# Patient Record
Sex: Female | Born: 1995 | Race: Black or African American | Hispanic: No | Marital: Single | Smoking: Former smoker
Health system: Southern US, Community
[De-identification: ages and names within clinical notes are randomized; demographics above are authoritative.]

## PROBLEM LIST (undated history)

## (undated) DIAGNOSIS — L309 Dermatitis, unspecified: Secondary | ICD-10-CM

## (undated) DIAGNOSIS — J45909 Unspecified asthma, uncomplicated: Secondary | ICD-10-CM

## (undated) DIAGNOSIS — B009 Herpesviral infection, unspecified: Secondary | ICD-10-CM

## (undated) DIAGNOSIS — D649 Anemia, unspecified: Secondary | ICD-10-CM

## (undated) HISTORY — PX: NO PAST SURGERIES: SHX2092

---
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
## 2017-02-03 DIAGNOSIS — R112 Nausea with vomiting, unspecified: Secondary | ICD-10-CM | POA: Insufficient documentation

## 2017-02-03 DIAGNOSIS — R197 Diarrhea, unspecified: Secondary | ICD-10-CM | POA: Diagnosis not present

## 2017-02-03 NOTE — ED Notes (Signed)
Pt c/o weakness, n/v x 4 and diarrhea x 8 starting 3 days ago.

## 2017-02-04 ENCOUNTER — Encounter (HOSPITAL_COMMUNITY): Payer: Self-pay

## 2017-02-04 ENCOUNTER — Emergency Department (HOSPITAL_COMMUNITY)
Admission: EM | Admit: 2017-02-04 | Discharge: 2017-02-04 | Discharge status: Against Medical Advice | Payer: BLUE CROSS/BLUE SHIELD | Attending: Emergency Medicine | Admitting: Emergency Medicine

## 2017-02-04 DIAGNOSIS — R112 Nausea with vomiting, unspecified: Secondary | ICD-10-CM

## 2017-02-04 DIAGNOSIS — R197 Diarrhea, unspecified: Secondary | ICD-10-CM

## 2017-02-04 LAB — CBC
HCT: 41.9 % (ref 36.0–46.0)
HEMOGLOBIN: 13.7 g/dL (ref 12.0–15.0)
MCH: 30.5 pg (ref 26.0–34.0)
MCHC: 32.7 g/dL (ref 30.0–36.0)
MCV: 93.3 fL (ref 78.0–100.0)
Platelets: 330 10*3/uL (ref 150–400)
RBC: 4.49 MIL/uL (ref 3.87–5.11)
RDW: 12.9 % (ref 11.5–15.5)
WBC: 6.2 10*3/uL (ref 4.0–10.5)

## 2017-02-04 LAB — COMPREHENSIVE METABOLIC PANEL
ALK PHOS: 62 U/L (ref 38–126)
ALT: 14 U/L (ref 14–54)
ANION GAP: 9 (ref 5–15)
AST: 24 U/L (ref 15–41)
Albumin: 4.3 g/dL (ref 3.5–5.0)
BILIRUBIN TOTAL: 0.5 mg/dL (ref 0.3–1.2)
BUN: 10 mg/dL (ref 6–20)
CALCIUM: 9.1 mg/dL (ref 8.9–10.3)
CO2: 22 mmol/L (ref 22–32)
Chloride: 106 mmol/L (ref 101–111)
Creatinine, Ser: 0.78 mg/dL (ref 0.44–1.00)
Glucose, Bld: 61 mg/dL — ABNORMAL LOW (ref 65–99)
Potassium: 3.3 mmol/L — ABNORMAL LOW (ref 3.5–5.1)
SODIUM: 137 mmol/L (ref 135–145)
TOTAL PROTEIN: 8.4 g/dL — AB (ref 6.5–8.1)

## 2017-02-04 LAB — URINALYSIS, ROUTINE W REFLEX MICROSCOPIC
Bilirubin Urine: NEGATIVE
Glucose, UA: NEGATIVE mg/dL
Hgb urine dipstick: NEGATIVE
Ketones, ur: NEGATIVE mg/dL
Leukocytes, UA: NEGATIVE
NITRITE: NEGATIVE
PROTEIN: NEGATIVE mg/dL
SPECIFIC GRAVITY, URINE: 1.013 (ref 1.005–1.030)
pH: 6 (ref 5.0–8.0)

## 2017-02-04 LAB — LIPASE, BLOOD: Lipase: 31 U/L (ref 11–51)

## 2017-02-04 LAB — POC URINE PREG, ED: PREG TEST UR: NEGATIVE

## 2017-02-04 MED ORDER — ONDANSETRON 8 MG PO TBDP
8.0000 mg | ORAL_TABLET | Freq: Once | ORAL | Status: AC
  Administered 2017-02-04: 8 mg via ORAL
  Filled 2017-02-04: qty 1

## 2017-02-04 MED ORDER — POTASSIUM CHLORIDE CRYS ER 20 MEQ PO TBCR
40.0000 meq | EXTENDED_RELEASE_TABLET | Freq: Once | ORAL | Status: AC
  Administered 2017-02-04: 40 meq via ORAL
  Filled 2017-02-04: qty 2

## 2017-02-04 MED ORDER — LOPERAMIDE HCL 2 MG PO CAPS
4.0000 mg | ORAL_CAPSULE | Freq: Once | ORAL | Status: AC
  Administered 2017-02-04: 4 mg via ORAL
  Filled 2017-02-04: qty 2

## 2017-02-04 NOTE — ED Notes (Signed)
Pt is alert and oriented x 4 and verbally responsive. Pt is c/o N/V/D. Pt denies abd pain at this time. Pt was given juice and soda to correct glucose of 60, and peanut butter and cracker.

## 2017-02-04 NOTE — ED Notes (Signed)
Pt stated that she had to leave " my nephew is not breathing and I have to go wait for the ambulance, pt stated that she would come back. Made Dr. Lynelle DoctorKnapp aware.

## 2017-02-04 NOTE — ED Triage Notes (Signed)
Pt reports 3-4 days of n/v, diarrhea, and 6/10 abd pain. Pt A+OX4, speaking in complete sentences, ambulatory to triage.

## 2017-02-04 NOTE — ED Notes (Signed)
Unable to obtain discharge  V/S or signature for AMA as pt left in a rush and without furthur evaluation.
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
## 2017-02-04 NOTE — ED Notes (Signed)
Pt stated no nausea.

## 2017-04-15 ENCOUNTER — Emergency Department (HOSPITAL_COMMUNITY)
Admission: EM | Admit: 2017-04-15 | Discharge: 2017-04-15 | Discharge status: Against Medical Advice | Payer: BLUE CROSS/BLUE SHIELD | Attending: Emergency Medicine | Admitting: Emergency Medicine

## 2017-04-15 DIAGNOSIS — Z5321 Procedure and treatment not carried out due to patient leaving prior to being seen by health care provider: Secondary | ICD-10-CM | POA: Insufficient documentation

## 2017-04-15 NOTE — ED Notes (Signed)
Called pt 3x and no response

## 2017-04-17 ENCOUNTER — Emergency Department (HOSPITAL_COMMUNITY)
Admission: EM | Admit: 2017-04-17 | Discharge: 2017-04-17 | Disposition: A | Discharge status: Home / Self Care | Payer: BLUE CROSS/BLUE SHIELD

## 2017-04-17 NOTE — ED Notes (Addendum)
Writer called for room assignment in triage, no response. 

## 2017-05-17 ENCOUNTER — Emergency Department (HOSPITAL_COMMUNITY)
Admission: EM | Admit: 2017-05-17 | Discharge: 2017-05-17 | Disposition: A | Discharge status: Home / Self Care | Payer: BLUE CROSS/BLUE SHIELD | Attending: Emergency Medicine | Admitting: Emergency Medicine

## 2017-05-17 ENCOUNTER — Encounter (HOSPITAL_COMMUNITY): Payer: Self-pay

## 2017-05-17 DIAGNOSIS — S0181XA Laceration without foreign body of other part of head, initial encounter: Secondary | ICD-10-CM

## 2017-05-17 DIAGNOSIS — W208XXA Other cause of strike by thrown, projected or falling object, initial encounter: Secondary | ICD-10-CM | POA: Insufficient documentation

## 2017-05-17 DIAGNOSIS — Y939 Activity, unspecified: Secondary | ICD-10-CM | POA: Diagnosis not present

## 2017-05-17 DIAGNOSIS — Y92009 Unspecified place in unspecified non-institutional (private) residence as the place of occurrence of the external cause: Secondary | ICD-10-CM | POA: Insufficient documentation

## 2017-05-17 DIAGNOSIS — Y998 Other external cause status: Secondary | ICD-10-CM | POA: Diagnosis not present

## 2017-05-17 MED ORDER — LIDOCAINE HCL (PF) 1 % IJ SOLN
5.0000 mL | Freq: Once | INTRAMUSCULAR | Status: AC
  Administered 2017-05-17: 5 mL via INTRADERMAL

## 2017-05-17 MED ORDER — LIDOCAINE HCL 2 % IJ SOLN: 10.0000 mL | Freq: Once | INTRAMUSCULAR | Status: DC

## 2017-05-17 MED ORDER — TETANUS-DIPHTH-ACELL PERTUSSIS 5-2.5-18.5 LF-MCG/0.5 IM SUSP
0.5000 mL | Freq: Once | INTRAMUSCULAR | Status: AC
  Administered 2017-05-17: 0.5 mL via INTRAMUSCULAR
  Filled 2017-05-17: qty 0.5

## 2017-05-17 NOTE — Discharge Instructions (Signed)
Please read attached information regarding wound care. Return in 3-5 days for suture removal, which can be done at ED, urgent care or PCP office. Return to ED sooner for drainage from site, swelling, fevers, signs of infection.
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
## 2017-07-31 ENCOUNTER — Emergency Department (HOSPITAL_COMMUNITY)
Admission: EM | Admit: 2017-07-31 | Discharge: 2017-07-31 | Disposition: A | Discharge status: Home / Self Care | Payer: BLUE CROSS/BLUE SHIELD | Attending: Emergency Medicine | Admitting: Emergency Medicine

## 2017-07-31 ENCOUNTER — Encounter (HOSPITAL_COMMUNITY): Payer: Self-pay | Admitting: Emergency Medicine

## 2017-07-31 DIAGNOSIS — Z79891 Long term (current) use of opiate analgesic: Secondary | ICD-10-CM | POA: Diagnosis not present

## 2017-07-31 DIAGNOSIS — J029 Acute pharyngitis, unspecified: Secondary | ICD-10-CM | POA: Diagnosis present

## 2017-07-31 LAB — RAPID STREP SCREEN (MED CTR MEBANE ONLY): STREPTOCOCCUS, GROUP A SCREEN (DIRECT): NEGATIVE

## 2017-07-31 MED ORDER — LIDOCAINE VISCOUS 2 % MT SOLN: 15.0000 mL | OROMUCOSAL | 0 refills | Status: DC | PRN

## 2017-07-31 MED ORDER — DEXAMETHASONE SODIUM PHOSPHATE 10 MG/ML IJ SOLN
10.0000 mg | Freq: Once | INTRAMUSCULAR | Status: AC
  Administered 2017-07-31: 10 mg via INTRAMUSCULAR
  Filled 2017-07-31: qty 1

## 2017-07-31 NOTE — ED Triage Notes (Signed)
Pt comes in with complaints of a sore throat that she is unsure when started but stated the unbearable pain began today.  Reports spots on the back of her throat. No spots noted during assessment by this RN.  Pt speaking in full sentences in no acute distress. Afebrile.

## 2017-07-31 NOTE — Discharge Instructions (Signed)
Take the prescribed medication as directed. Your strep culture should come back in a day or two.  We will call you if abnormal. Follow-up with your primary care doctor if any ongoing issues. Return to the ED for new or worsening symptoms.

## 2017-07-31 NOTE — ED Provider Notes (Signed)
[facility]-EMERGENCY DEPT Provider Note   CSN: [id number] Arrival date & time: 07/31/17  0000     History   Chief Complaint Chief Complaint  Patient presents with  . Sore Throat    HPI Sharon Morris is a 21 y.o. female.  The history is provided by the patient and medical records.  Sore Throat     21 y.o. F with hx of eczema and herpes, presenting to the ED for sore throat.  States it began yesterday evening.  No associated symptoms.  No trouble swallowing, just painful when doing so.  No fever, chills, sweats.  No sick contacts.  No medications tried PTA.     Past Medical History:  Diagnosis Date  . Eczema   . Herpes    last outbreak in August    There are no active problems to display for this patient.   Past Surgical History:  Procedure Laterality Date  . NO PAST SURGERIES      OB History    No data available       Home Medications    Prior to Admission medications   Medication Sig Start Date End Date Taking? Authorizing Provider  cetirizine (ZYRTEC) 10 MG tablet Take 10 mg by mouth daily as needed for allergies.     [provider]  triamcinolone (NASACORT) 55 MCG/ACT AERO nasal inhaler Place 2 sprays into the nose daily as needed (for rhinitis).     [provider]  triamcinolone ointment (KENALOG) 0.1 % Apply 1 application topically daily. 01/10/17   [provider]  valACYclovir (VALTREX) 500 MG tablet Take 500 mg by mouth 2 (two) times daily.    [provider]    Family History Family History  Problem Relation Age of Onset  . Hypertension Other   . Diabetes Other   . Cancer Other   . Stroke Other     Social History Social History   Tobacco Use  . Smoking status: Never Smoker  . Smokeless tobacco: Never Used  Substance Use Topics  . Alcohol use: No  . Drug use: Yes    Types: Marijuana     Allergies   Patient has no known allergies.   Review of Systems Review of Systems    HENT: Positive for sore throat.   All other systems reviewed and are negative.    Physical Exam Updated Vital Signs BP 116/70 (BP Location: Left Arm)   Pulse 68   Temp 97.6 F (36.4 C) (Oral)   Resp 18   LMP 07/23/2017 (Approximate)   SpO2 98%   Physical Exam  Constitutional: She is oriented to person, place, and time. She appears well-developed and well-nourished.  HENT:  Head: Normocephalic and atraumatic.  Mouth/Throat: Uvula is midline, oropharynx is clear and moist and mucous membranes are normal.  Tonsils 1+ bilaterally without exudates; uvula midline without evidence of peritonsillar abscess; handling secretions appropriately; no difficulty swallowing or speaking; normal phonation without stridor  Eyes: Conjunctivae and EOM are normal. Pupils are equal, round, and reactive to light.  Neck: Normal range of motion.  Cardiovascular: Normal rate, regular rhythm and normal heart sounds.  Pulmonary/Chest: Effort normal and breath sounds normal.  Abdominal: Soft. Bowel sounds are normal.  Musculoskeletal: Normal range of motion.  Neurological: She is alert and oriented to person, place, and time.  Skin: Skin is warm and dry.  Psychiatric: She has a normal mood and affect.  Nursing note and vitals reviewed.    ED Treatments /  Results  Labs (all labs ordered are listed, but only abnormal results are displayed) Labs Reviewed  RAPID STREP SCREEN (NOT AT [facility])  CULTURE, GROUP A STREP [facility])    EKG  EKG Interpretation None       Radiology No results found.  Procedures Procedures (including critical care time)  Medications Ordered in ED Medications  dexamethasone (DECADRON) injection 10 mg (10 mg Intramuscular Given 07/31/17 0201)     Initial Impression / Assessment and Plan / ED Course  I have reviewed the triage vital signs and the nursing notes.  Pertinent labs & imaging results that were available during my care of the patient were reviewed by me and  considered in my medical decision making (see chart for details).  21 year old female here with sore throat.  Began yesterday.  No associated symptoms.  She is afebrile and nontoxic.  Does have mild tonsillar edema bilaterally without exudate.  Handling secretions well.  Normal phonation without stridor.  No noted lymphadenopathy.  No findings of peritonsillar abscess.  Rapid strep was sent and is negative, culture pending.  May represent viral pharyngitis.  She was given dose of IM Decadron here to help with tonsillar edema and discomfort.  Will discharge home with viscous lidocaine.  Can follow-up with PCP for any ongoing issues.  Discussed plan with patient, she acknowledged understanding and agreed with plan of care.  Return precautions given for new or worsening symptoms.  Final Clinical Impressions(s) / ED Diagnoses   Final diagnoses:  Sore throat    ED Discharge Orders        Ordered    lidocaine (XYLOCAINE) 2 % solution  As needed     07/31/17 0214       Garlon HatchetSanders, Geneva Barrero M, PA-C 07/31/17 [phone]    Dione BoozeGlick, David, MD 07/31/17 [phone]

## 2017-08-02 LAB — CULTURE, GROUP A STREP (THRC)

## 2017-09-05 ENCOUNTER — Encounter (HOSPITAL_COMMUNITY): Payer: Self-pay

## 2017-09-05 ENCOUNTER — Emergency Department (HOSPITAL_COMMUNITY)
Admission: EM | Admit: 2017-09-05 | Discharge: 2017-09-05 | Disposition: A | Discharge status: Home / Self Care | Payer: BLUE CROSS/BLUE SHIELD | Attending: Emergency Medicine | Admitting: Emergency Medicine

## 2017-09-05 DIAGNOSIS — R309 Painful micturition, unspecified: Secondary | ICD-10-CM | POA: Insufficient documentation

## 2017-09-05 DIAGNOSIS — Z5321 Procedure and treatment not carried out due to patient leaving prior to being seen by health care provider: Secondary | ICD-10-CM | POA: Insufficient documentation

## 2017-09-05 LAB — URINALYSIS, ROUTINE W REFLEX MICROSCOPIC
BILIRUBIN URINE: NEGATIVE
Bacteria, UA: NONE SEEN
GLUCOSE, UA: NEGATIVE mg/dL
Ketones, ur: NEGATIVE mg/dL
LEUKOCYTES UA: NEGATIVE
Nitrite: NEGATIVE
PH: 7 (ref 5.0–8.0)
PROTEIN: NEGATIVE mg/dL
Specific Gravity, Urine: 1.021 (ref 1.005–1.030)

## 2017-09-05 LAB — PREGNANCY, URINE: Preg Test, Ur: NEGATIVE

## 2017-09-05 NOTE — ED Notes (Signed)
Bed: [facility] Expected date:  Expected time:  Means of arrival:  Comments: 

## 2017-09-05 NOTE — ED Triage Notes (Signed)
Pt complains of burning when urinating

## 2017-09-05 NOTE — ED Notes (Signed)
Pt no longer in the lobby 

## 2017-09-05 NOTE — ED Triage Notes (Signed)
Pt called from triage no answer 

## 2017-10-22 ENCOUNTER — Emergency Department (HOSPITAL_COMMUNITY): Payer: BLUE CROSS/BLUE SHIELD

## 2017-10-22 ENCOUNTER — Encounter (HOSPITAL_COMMUNITY): Payer: Self-pay | Admitting: Emergency Medicine

## 2017-10-22 DIAGNOSIS — Z5321 Procedure and treatment not carried out due to patient leaving prior to being seen by health care provider: Secondary | ICD-10-CM | POA: Insufficient documentation

## 2017-10-22 DIAGNOSIS — R531 Weakness: Secondary | ICD-10-CM | POA: Diagnosis not present

## 2017-10-22 LAB — INFLUENZA PANEL BY PCR (TYPE A & B)
Influenza A By PCR: POSITIVE — AB
Influenza B By PCR: NEGATIVE

## 2017-10-22 MED ORDER — ACETAMINOPHEN 325 MG PO TABS
650.0000 mg | ORAL_TABLET | Freq: Once | ORAL | Status: AC | PRN
  Administered 2017-10-22: 650 mg via ORAL
  Filled 2017-10-22: qty 2

## 2017-10-22 NOTE — ED Triage Notes (Signed)
Pt comes in with complaints of flu like symptoms that started yesterday. Pt endorses 2 episodes of emesis. States she feels very weak. Febrile on assessment.

## 2017-10-23 ENCOUNTER — Emergency Department (HOSPITAL_COMMUNITY)
Admission: EM | Admit: 2017-10-23 | Discharge: 2017-10-23 | Disposition: A | Discharge status: Home / Self Care | Payer: BLUE CROSS/BLUE SHIELD | Attending: Emergency Medicine | Admitting: Emergency Medicine

## 2017-10-23 NOTE — ED Notes (Signed)
Pt seen leaving the lobby with her family

## 2017-12-02 ENCOUNTER — Emergency Department (HOSPITAL_COMMUNITY): Payer: BLUE CROSS/BLUE SHIELD

## 2017-12-02 ENCOUNTER — Other Ambulatory Visit: Payer: Self-pay

## 2017-12-02 ENCOUNTER — Emergency Department (HOSPITAL_COMMUNITY)
Admission: EM | Admit: 2017-12-02 | Discharge: 2017-12-02 | Disposition: A | Discharge status: Home / Self Care | Payer: BLUE CROSS/BLUE SHIELD | Attending: Emergency Medicine | Admitting: Emergency Medicine

## 2017-12-02 ENCOUNTER — Encounter (HOSPITAL_COMMUNITY): Payer: Self-pay | Admitting: *Deleted

## 2017-12-02 DIAGNOSIS — F17290 Nicotine dependence, other tobacco product, uncomplicated: Secondary | ICD-10-CM | POA: Diagnosis not present

## 2017-12-02 DIAGNOSIS — Z79899 Other long term (current) drug therapy: Secondary | ICD-10-CM | POA: Diagnosis not present

## 2017-12-02 DIAGNOSIS — J45909 Unspecified asthma, uncomplicated: Secondary | ICD-10-CM | POA: Diagnosis present

## 2017-12-02 DIAGNOSIS — J209 Acute bronchitis, unspecified: Secondary | ICD-10-CM | POA: Diagnosis not present

## 2017-12-02 HISTORY — DX: Unspecified asthma, uncomplicated: J45.909

## 2017-12-02 MED ORDER — ALBUTEROL SULFATE (2.5 MG/3ML) 0.083% IN NEBU
INHALATION_SOLUTION | RESPIRATORY_TRACT | Status: AC
  Filled 2017-12-02: qty 6

## 2017-12-02 MED ORDER — ALBUTEROL SULFATE (2.5 MG/3ML) 0.083% IN NEBU
5.0000 mg | INHALATION_SOLUTION | Freq: Once | RESPIRATORY_TRACT | Status: AC
Start: 2017-12-02 — End: 2017-12-02
  Administered 2017-12-02: 5 mg via RESPIRATORY_TRACT

## 2017-12-02 MED ORDER — PREDNISONE 20 MG PO TABS: 40.0000 mg | ORAL_TABLET | Freq: Every day | ORAL | 0 refills | Status: DC

## 2017-12-02 MED ORDER — ALBUTEROL SULFATE 108 (90 BASE) MCG/ACT IN AEPB: 1.0000 | INHALATION_SPRAY | Freq: Four times a day (QID) | RESPIRATORY_TRACT | 0 refills | Status: DC | PRN

## 2017-12-02 NOTE — ED Notes (Signed)
Improved sob and wheezing after breathing tx.

## 2017-12-02 NOTE — ED Triage Notes (Signed)
Pt states cough for several days and increased sob, wheezing and chest tightness today.  Wheezing noted.

## 2017-12-02 NOTE — Discharge Instructions (Signed)
Continue antibiotic prescribed to you Use inhaler every 4-6 hours as needed for shortness of breath/wheezing Take prednisone once a day for 5 days Follow up with your doctor

## 2017-12-02 NOTE — ED Provider Notes (Signed)
[facility] EMERGENCY DEPARTMENT Provider Note   CSN: [id number] Arrival date & time: 12/02/17  1545     History   Chief Complaint Chief Complaint  Patient presents with  . Asthma    HPI Sharon Morris is a 22 y.o. female who presents with a cough, SOB, wheezing. PMH significant for hx of smoking. She had the flu in last Jan. She developed another viral illness in February. She's had a persistent productive cough since then. She sought medical care on 3/8 and was diagnosed with bronchitis. She was given Doxycycline and cough medicine. Over the past three days she reports more shortness of breath, chest tightness, and wheezing. She is unsure if she's actually diagnosed with asthma or not. Her mother in the room states she did not have asthma as a child but her siblings did. The patient denies current fever or chills. She feels better after a breathing tx in triage.  HPI  Past Medical History:  Diagnosis Date  . Asthma   . Eczema   . Herpes    last outbreak in August    There are no active problems to display for this patient.   Past Surgical History:  Procedure Laterality Date  . NO PAST SURGERIES      OB History    No data available       Home Medications    Prior to Admission medications   Medication Sig Start Date End Date Taking? Authorizing Provider  Albuterol Sulfate (PROAIR RESPICLICK) 108 (90 Base) MCG/ACT AEPB Inhale 1-2 puffs into the lungs every 6 (six) hours as needed. 12/02/17   Bethel Born, PA-C  cetirizine (ZYRTEC) 10 MG tablet Take 10 mg by mouth daily as needed for allergies.     [provider]  lidocaine (XYLOCAINE) 2 % solution Use as directed 15 mLs as needed in the mouth or throat for mouth pain. 07/31/17   Garlon Hatchet, PA-C  predniSONE (DELTASONE) 20 MG tablet Take 2 tablets (40 mg total) by mouth daily. 12/02/17   Bethel Born, PA-C  triamcinolone (NASACORT) 55 MCG/ACT AERO nasal inhaler Place 2 sprays  into the nose daily as needed (for rhinitis).     [provider]  triamcinolone ointment (KENALOG) 0.1 % Apply 1 application topically daily. 01/10/17   [provider]  valACYclovir (VALTREX) 500 MG tablet Take 500 mg by mouth 2 (two) times daily.    [provider]    Family History Family History  Problem Relation Age of Onset  . Hypertension Other   . Diabetes Other   . Cancer Other   . Stroke Other     Social History Social History   Tobacco Use  . Smoking status: Current Every Day Smoker    Types: Cigars  . Smokeless tobacco: Never Used  Substance Use Topics  . Alcohol use: Yes  . Drug use: Yes    Types: Marijuana     Allergies   Patient has no known allergies.   Review of Systems Review of Systems  HENT: Negative for congestion and rhinorrhea.   Respiratory: Positive for cough, shortness of breath and wheezing.   Cardiovascular: Negative for chest pain.     Physical Exam Updated Vital Signs BP 118/79 (BP Location: Right Arm)   Pulse (!) 101   Temp 98.2 F (36.8 C) (Oral)   Resp (!) 26   Ht 5\' 3"  (1.6 m)   Wt 59.9 kg (132 lb)   LMP 11/19/2017  BMI 23.38 kg/m   Physical Exam  Constitutional: She is oriented to person, place, and time. She appears well-developed and well-nourished. No distress.  NAD. Occasional coarse cough  HENT:  Head: Normocephalic and atraumatic.  Eyes: Conjunctivae are normal. Pupils are equal, round, and reactive to light. Right eye exhibits no discharge. Left eye exhibits no discharge. No scleral icterus.  Neck: Normal range of motion.  Cardiovascular: Normal rate and regular rhythm.  Pulmonary/Chest: Effort normal and breath sounds normal. No respiratory distress.  Abdominal: Soft. Bowel sounds are normal. She exhibits no distension. There is no tenderness.  Neurological: She is alert and oriented to person, place, and time.  Skin: Skin is warm and dry.  Psychiatric: She has a normal mood and  affect. Her behavior is normal.  Nursing note and vitals reviewed.    ED Treatments / Results  Labs (all labs ordered are listed, but only abnormal results are displayed) Labs Reviewed - No data to display  EKG  EKG Interpretation None       Radiology Dg Chest 2 View  Result Date: 12/02/2017 CLINICAL DATA:  Difficulty breathing EXAM: CHEST - 2 VIEW COMPARISON:  10/22/2017 FINDINGS: Mild peribronchial thickening. Heart and mediastinal contours are within normal limits. No focal opacities or effusions. No acute bony abnormality. IMPRESSION: Bronchitic changes. Electronically Signed   By: Charlett NoseKevin  Dover M.D.   On: 12/02/2017 18:01    Procedures Procedures (including critical care time)  Medications Ordered in ED Medications  albuterol (PROVENTIL) (2.5 MG/3ML) 0.083% nebulizer solution 5 mg (5 mg Nebulization Given 12/02/17 1611)     Initial Impression / Assessment and Plan / ED Course  I have reviewed the triage vital signs and the nursing notes.  Pertinent labs & imaging results that were available during my care of the patient were reviewed by me and considered in my medical decision making (see chart for details).  22 year old female presents with acute bronchitis and now shortness of breath and wheezing.  She is mildly tachycardic in triage.  Otherwise vital signs are normal.  Chest x-ray is remarkable for bronchitis without evidence of pneumonia.  Lungs are clear to auscultation bilaterally after a breathing treatment.  She reports feeling better.  Will discharge with prescription for an inhaler and steroids.  She was advised to follow-up with her doctor if she is not improving.  Final Clinical Impressions(s) / ED Diagnoses   Final diagnoses:  Acute bronchitis, unspecified organism    ED Discharge Orders        Ordered    Albuterol Sulfate (PROAIR RESPICLICK) 108 (90 Base) MCG/ACT AEPB  Every 6 hours PRN     12/02/17 1912    predniSONE (DELTASONE) 20 MG tablet  Daily      12/02/17 1912       Bethel BornGekas, Henretter Piekarski Marie, PA-C 12/02/17 1931    Mancel BaleWentz, Elliott, MD 12/03/17 [phone]

## 2018-01-11 ENCOUNTER — Emergency Department (HOSPITAL_COMMUNITY): Payer: BLUE CROSS/BLUE SHIELD

## 2018-01-11 ENCOUNTER — Encounter (HOSPITAL_COMMUNITY): Payer: Self-pay | Admitting: Emergency Medicine

## 2018-01-11 ENCOUNTER — Emergency Department (HOSPITAL_COMMUNITY)
Admission: EM | Admit: 2018-01-11 | Discharge: 2018-01-11 | Disposition: A | Discharge status: Home / Self Care | Payer: BLUE CROSS/BLUE SHIELD | Attending: Emergency Medicine | Admitting: Emergency Medicine

## 2018-01-11 DIAGNOSIS — F17290 Nicotine dependence, other tobacco product, uncomplicated: Secondary | ICD-10-CM | POA: Insufficient documentation

## 2018-01-11 DIAGNOSIS — Z79899 Other long term (current) drug therapy: Secondary | ICD-10-CM | POA: Diagnosis not present

## 2018-01-11 DIAGNOSIS — J4521 Mild intermittent asthma with (acute) exacerbation: Secondary | ICD-10-CM | POA: Diagnosis not present

## 2018-01-11 DIAGNOSIS — R0602 Shortness of breath: Secondary | ICD-10-CM | POA: Diagnosis present

## 2018-01-11 MED ORDER — DEXAMETHASONE SODIUM PHOSPHATE 10 MG/ML IJ SOLN
10.0000 mg | Freq: Once | INTRAMUSCULAR | Status: AC
  Administered 2018-01-11: 10 mg via INTRAMUSCULAR
  Filled 2018-01-11: qty 1

## 2018-01-11 MED ORDER — ALBUTEROL SULFATE HFA 108 (90 BASE) MCG/ACT IN AERS
1.0000 | INHALATION_SPRAY | Freq: Once | RESPIRATORY_TRACT | Status: AC
  Administered 2018-01-11: 1 via RESPIRATORY_TRACT
  Filled 2018-01-11: qty 6.7

## 2018-01-11 MED ORDER — ALBUTEROL SULFATE (2.5 MG/3ML) 0.083% IN NEBU
5.0000 mg | INHALATION_SOLUTION | Freq: Once | RESPIRATORY_TRACT | Status: AC
  Administered 2018-01-11: 5 mg via RESPIRATORY_TRACT
  Filled 2018-01-11: qty 6

## 2018-01-11 MED ORDER — IPRATROPIUM-ALBUTEROL 0.5-2.5 (3) MG/3ML IN SOLN
3.0000 mL | Freq: Once | RESPIRATORY_TRACT | Status: AC
  Administered 2018-01-11: 3 mL via RESPIRATORY_TRACT
  Filled 2018-01-11: qty 3

## 2018-01-11 MED ORDER — ALBUTEROL SULFATE HFA 108 (90 BASE) MCG/ACT IN AERS: 1.0000 | INHALATION_SPRAY | Freq: Four times a day (QID) | RESPIRATORY_TRACT | 0 refills | Status: DC | PRN

## 2018-01-11 NOTE — ED Triage Notes (Signed)
Pt c/o shortness of breath and increased use of inhalers. Wheezing in bilateral lobes.

## 2018-01-11 NOTE — ED Notes (Signed)
Pt states that when she woke up this AM, she felt short of breath and when her sister checked her pulse ox, it was low. States that she ran out of her inhaler last night and will need a new one.

## 2018-01-11 NOTE — ED Provider Notes (Signed)
[facility] EMERGENCY DEPARTMENT Provider Note   CSN: [id number] Arrival date & time: 01/11/18  1626     History   Chief Complaint Chief Complaint  Patient presents with  . Asthma    HPI Sharon Morris is a 22 y.o. female with past medical history of asthma, who presents to ED for evaluation of shortness of breath and wheezing since this morning.  She states that she ran out of her albuterol inhaler yesterday which is the cause of her worsening symptoms.  She also reports intermittent productive cough and nasal congestion.  Denies any fevers, vomiting, sick contacts with similar symptoms, chest pain, hemoptysis.  HPI  Past Medical History:  Diagnosis Date  . Asthma   . Eczema   . Herpes    last outbreak in August    There are no active problems to display for this patient.   Past Surgical History:  Procedure Laterality Date  . NO PAST SURGERIES       OB History   None      Home Medications    Prior to Admission medications   Medication Sig Start Date End Date Taking? Authorizing Provider  albuterol (PROVENTIL HFA;VENTOLIN HFA) 108 (90 Base) MCG/ACT inhaler Inhale 1-2 puffs into the lungs every 6 (six) hours as needed for wheezing or shortness of breath. 01/11/18   Rylea Selway, PA-C  cetirizine (ZYRTEC) 10 MG tablet Take 10 mg by mouth daily as needed for allergies.     [provider]  lidocaine (XYLOCAINE) 2 % solution Use as directed 15 mLs as needed in the mouth or throat for mouth pain. 07/31/17   Garlon Hatchet, PA-C  predniSONE (DELTASONE) 20 MG tablet Take 2 tablets (40 mg total) by mouth daily. 12/02/17   Bethel Born, PA-C  triamcinolone (NASACORT) 55 MCG/ACT AERO nasal inhaler Place 2 sprays into the nose daily as needed (for rhinitis).     [provider]  triamcinolone ointment (KENALOG) 0.1 % Apply 1 application topically daily. 01/10/17   [provider]  valACYclovir (VALTREX) 500 MG tablet Take 500  mg by mouth 2 (two) times daily.    [provider]    Family History Family History  Problem Relation Age of Onset  . Hypertension Other   . Diabetes Other   . Cancer Other   . Stroke Other     Social History Social History   Tobacco Use  . Smoking status: Current Every Day Smoker    Types: Cigars  . Smokeless tobacco: Never Used  Substance Use Topics  . Alcohol use: Yes  . Drug use: Yes    Types: Marijuana     Allergies   Patient has no known allergies.   Review of Systems Review of Systems  Constitutional: Negative for chills and fever.  HENT: Positive for congestion. Negative for postnasal drip.   Respiratory: Positive for shortness of breath and wheezing. Negative for cough and choking.   Cardiovascular: Negative for chest pain.  Gastrointestinal: Negative for nausea and vomiting.     Physical Exam Updated Vital Signs BP 114/76 (BP Location: Left Arm)   Pulse 90   Temp 98.3 F (36.8 C) (Oral)   Resp 18   Ht 5\' 3"  (1.6 m)   SpO2 100%   BMI 23.38 kg/m   Physical Exam  Constitutional: She appears well-developed and well-nourished. No distress.  Nontoxic appearing and in no acute distress.  Speaking complete sentences without difficulty.  HENT:  Head: Normocephalic  and atraumatic.  Eyes: Conjunctivae and EOM are normal. No scleral icterus.  Neck: Normal range of motion.  Cardiovascular: Normal rate, regular rhythm and normal heart sounds.  Pulmonary/Chest: Effort normal. No respiratory distress. She has wheezes.  Improvement in wheezing with albuterol nebulizer solution given. Mild end expiratory wheezing heard in LLL field.  Neurological: She is alert.  Skin: No rash noted. She is not diaphoretic.  Psychiatric: She has a normal mood and affect.  Nursing note and vitals reviewed.    ED Treatments / Results  Labs (all labs ordered are listed, but only abnormal results are displayed) Labs Reviewed - No data to  display  EKG None  Radiology Dg Chest 2 View  Result Date: 01/11/2018 CLINICAL DATA:  Shortness of breath. EXAM: CHEST - 2 VIEW COMPARISON:  12/02/2017 FINDINGS: The heart size and mediastinal contours are within normal limits. There is no evidence of pulmonary edema, consolidation, pneumothorax, nodule or pleural fluid. The visualized skeletal structures are unremarkable. IMPRESSION: No active cardiopulmonary disease. Electronically Signed   By: Irish LackGlenn  Yamagata M.D.   On: 01/11/2018 19:12    Procedures Procedures (including critical care time)  Medications Ordered in ED Medications  albuterol (PROVENTIL HFA;VENTOLIN HFA) 108 (90 Base) MCG/ACT inhaler 1 puff (has no administration in time range)  albuterol (PROVENTIL) (2.5 MG/3ML) 0.083% nebulizer solution 5 mg (5 mg Nebulization Given 01/11/18 1700)  ipratropium-albuterol (DUONEB) 0.5-2.5 (3) MG/3ML nebulizer solution 3 mL (3 mLs Nebulization Given 01/11/18 1756)  dexamethasone (DECADRON) injection 10 mg (10 mg Intramuscular Given 01/11/18 1740)     Initial Impression / Assessment and Plan / ED Course  I have reviewed the triage vital signs and the nursing notes.  Pertinent labs & imaging results that were available during my care of the patient were reviewed by me and considered in my medical decision making (see chart for details).     Patient presents to ED for evaluation of asthma exacerbation.  States that she began experiencing wheezing and shortness of breath since this morning due to running out of her albuterol inhaler last night.  States this feels like her prior asthma exacerbations.  Denies any chest pain, hemoptysis, fevers.  On physical exam her wheezing is somewhat resolved after first nebulizer treatment was given prior to my evaluation.  She does have some end expiratory wheezing noted which is residual.  She is speaking complete sentences without difficulty.  Oxygen saturations 100% on room air. CXR shows no acute  abnormalities.  Patient reports improvement in her symptoms with DuoNeb given.  Resolution and wheezing noted.  Patient given Decadron IM and advised to follow-up with PCP for further evaluation if symptoms persist.  Was given inhaler with spacer here.  Advised to return for any severe worsening symptoms.  Portions of this note were generated with Scientist, clinical (histocompatibility and immunogenetics)Dragon dictation software. Dictation errors may occur despite best attempts at proofreading.   Final Clinical Impressions(s) / ED Diagnoses   Final diagnoses:  Mild intermittent asthma with exacerbation    ED Discharge Orders        Ordered    albuterol (PROVENTIL HFA;VENTOLIN HFA) 108 (90 Base) MCG/ACT inhaler  Every 6 hours PRN     01/11/18 1915       Dietrich PatesKhatri, Rogue Pautler, PA-C 01/11/18 1915    Shaune PollackIsaacs, Cameron, MD 01/11/18 1943

## 2018-08-11 IMAGING — CR DG CHEST 2V
2 series · 2 of 2 positions shown · non-contrast
Comparison: August 09, 2011

CLINICAL DATA: Fever.  Shortness of breath.

EXAM:
CHEST  2 VIEW

[w chest pa]
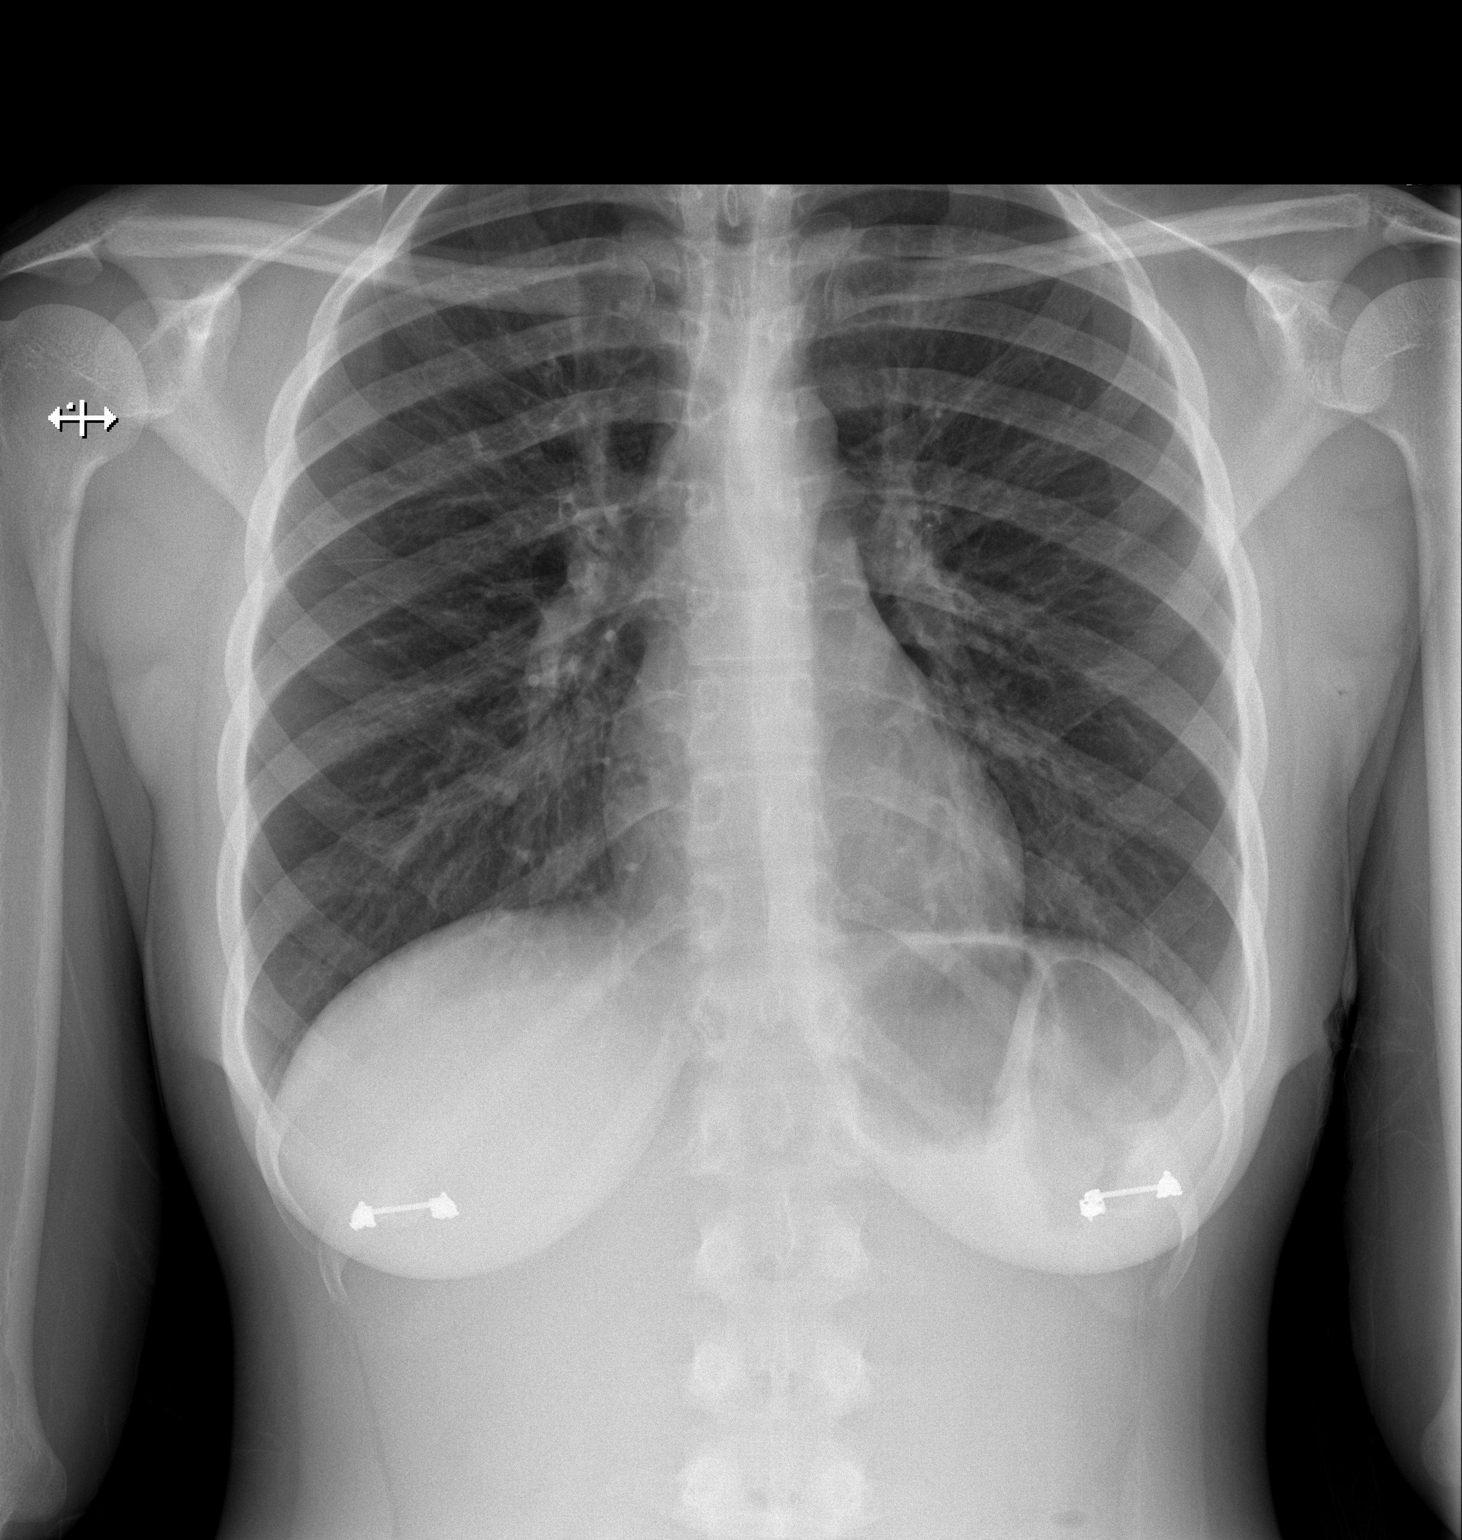

[w chest lat]
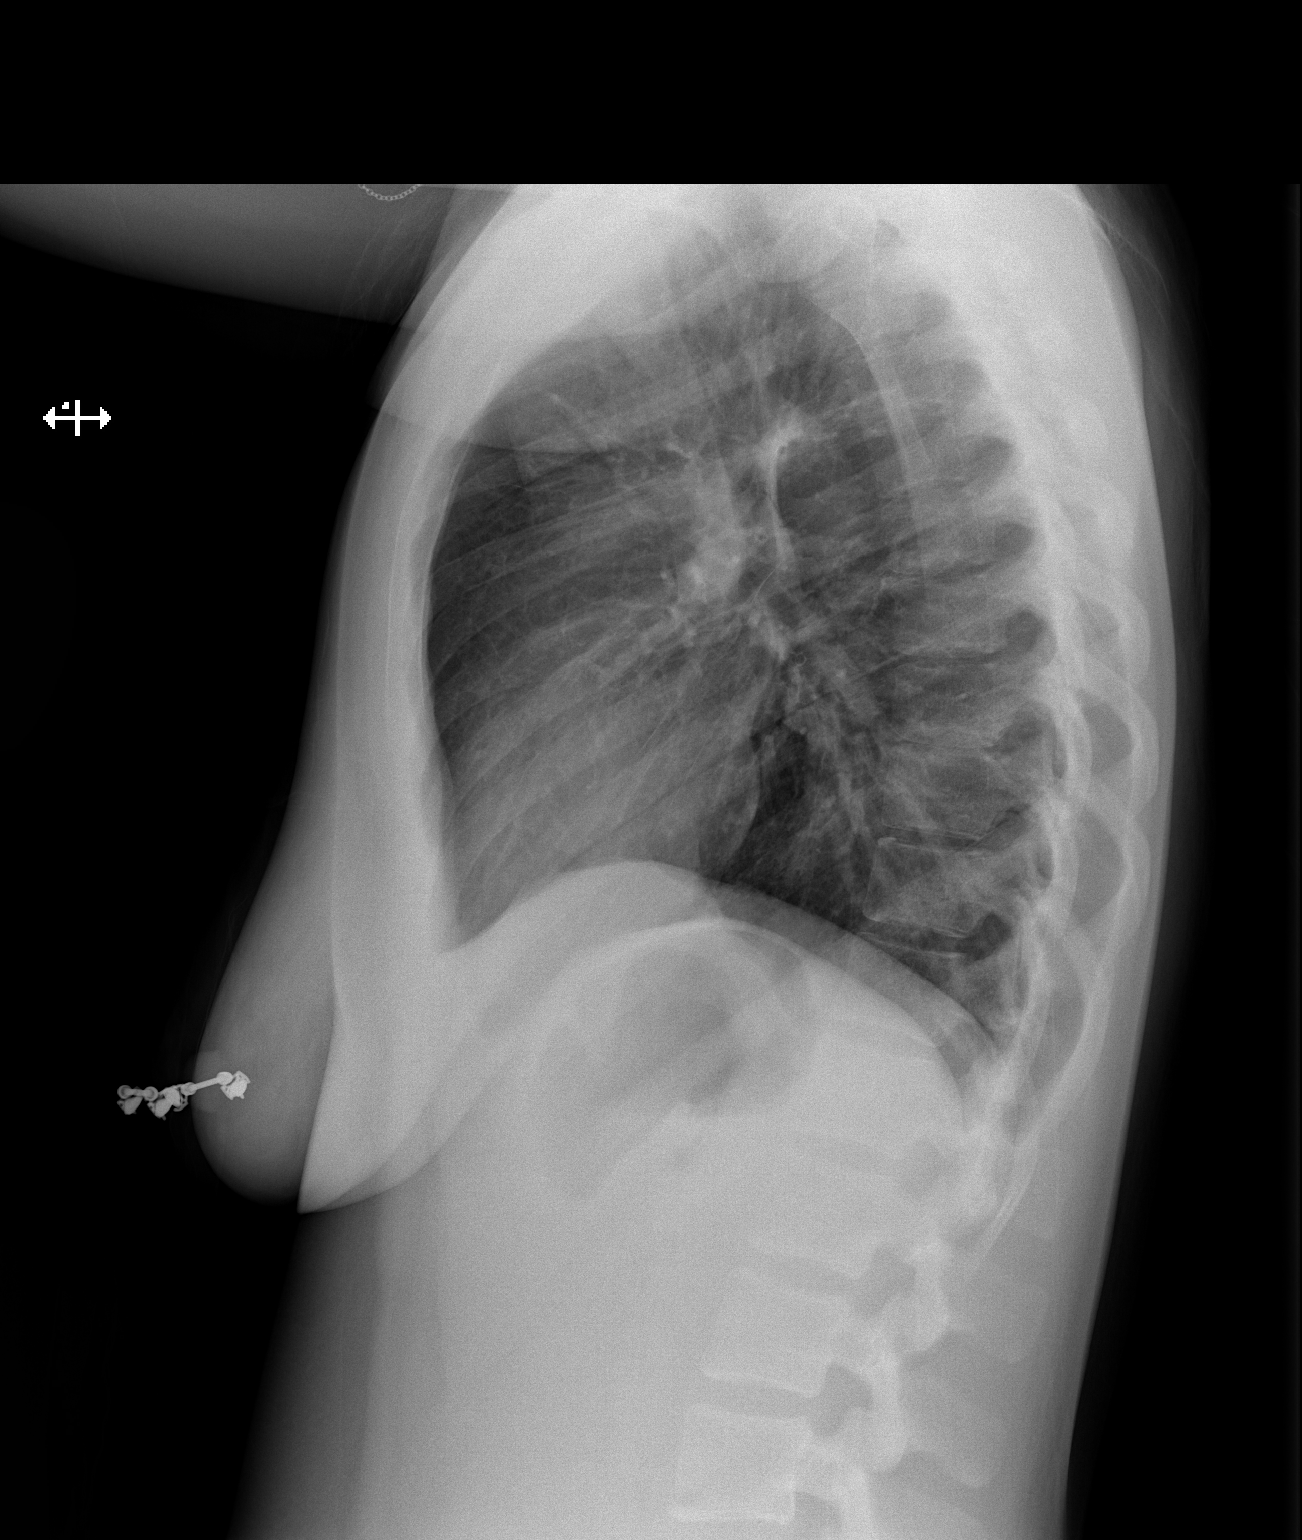

[2 of 2 positions shown; findings below may reference images not displayed]

FINDINGS: The heart size and mediastinal contours are within normal limits.
Both lungs are clear. The visualized skeletal structures are
unremarkable.
IMPRESSION: No active cardiopulmonary disease.

## 2018-09-22 NOTE — L&D Delivery Note (Addendum)
OB/GYN Faculty Practice Delivery Note  Sharon Morris is a 23 y.o. now G1P1001 s/p NSVD at [redacted]w[redacted]d who was admitted for SOL.   ROM: 3h 32m with clear fluid GBS Status: POS Maximum Maternal Temperature: 99.2  Delivery Note At 1:21 PM a viable female was delivered via Vaginal, Spontaneous (Presentation: Right Occiput Anterior). Tight nuchal cord x 1 loop. Unable to reduce on perineum, infant delivered completely and cord removed easily from neck.  APGAR: ,9/9; weight 6 lbs 10.5 oz (3020 gm).   Placenta status: Spontaneous, Intact.  Cord: 3 vessels with the following complications: None.  Cord pH: n/a  Anesthesia: Epidural Episiotomy: None Lacerations: None Suture Repair: none Est. Blood Loss (mL): 50   Postpartum Planning [x]  Mom to postpartum.  Baby to Couplet care / Skin to Skin.  [x]  plans to breast feed [x]  message to sent to schedule follow-up  [x]  vaccines -- Flu & TdaP declined  Laury Deep, MSN, CNM 09/13/19, 2:11 PM

## 2019-02-23 ENCOUNTER — Encounter: Payer: Self-pay | Admitting: General Practice

## 2019-03-01 ENCOUNTER — Ambulatory Visit (INDEPENDENT_AMBULATORY_CARE_PROVIDER_SITE_OTHER): Payer: BLUE CROSS/BLUE SHIELD | Admitting: *Deleted

## 2019-03-01 ENCOUNTER — Other Ambulatory Visit: Payer: Self-pay

## 2019-03-01 VITALS — Wt 135.0 lb

## 2019-03-01 DIAGNOSIS — Z3400 Encounter for supervision of normal first pregnancy, unspecified trimester: Secondary | ICD-10-CM

## 2019-03-01 HISTORY — DX: Encounter for supervision of normal first pregnancy, unspecified trimester: Z34.00

## 2019-03-01 MED ORDER — VITAFOL GUMMIES 3.33-0.333-34.8 MG PO CHEW: 3.0000 | CHEWABLE_TABLET | Freq: Every day | ORAL | 12 refills | Status: DC

## 2019-03-01 NOTE — Progress Notes (Signed)
   Virtual Visit via Telephone Note  I connected with [name] on 03/01/19 at  3:00 PM EDT by telephone and verified that I am speaking with the correct person using two identifiers.  Location: Patient: Sharon Morris MRN: [id number] Provider: Derl Barrow, RN  I discussed the limitations, risks, security and privacy concerns of performing an evaluation and management service by telephone and the availability of in person appointments. I also discussed with the patient that there may be a patient responsible charge related to this service. The patient expressed understanding and agreed to proceed.   History of Present Illness: PRENATAL INTAKE SUMMARY  [name] presents today New OB Nurse Interview.  OB History    Gravida  1   Para      Term      Preterm      AB      Living        SAB      TAB      Ectopic      Multiple      Live Births             I have reviewed the patient's medical, obstetrical, social, and family histories, medications, and available lab results.  SUBJECTIVE She has no unusual complaints   Observations/Objective: Initial nurse interview for history and lab work (New OB)  EDD: 09/27/2019 GA: [redacted]w[redacted]d G1P0  GENERAL APPEARANCE: non-face to face interview.  Assessment and Plan: Normal pregnancy Prenatal care All lab work will be completed at next visit Prenatal gummies ordered Ultrasound <14 weeks ordered to confirm dating/viability  Follow Up Instructions:  I discussed the assessment and treatment plan with the patient. The patient was provided an opportunity to ask questions and all were answered. The patient agreed with the plan and demonstrated an understanding of the instructions.   The patient was advised to call back or seek an in-person evaluation if the symptoms worsen or if the condition fails to improve as anticipated.  I provided 20 minutes of non-face-to-face time during this encounter.   Derl Barrow,  RN

## 2019-03-10 ENCOUNTER — Other Ambulatory Visit: Payer: Self-pay

## 2019-03-10 ENCOUNTER — Ambulatory Visit (HOSPITAL_COMMUNITY)
Admission: RE | Admit: 2019-03-10 | Discharge: 2019-03-10 | Disposition: A | Discharge status: Home / Self Care | Payer: BLUE CROSS/BLUE SHIELD | Source: Ambulatory Visit | Attending: Obstetrics and Gynecology | Admitting: Obstetrics and Gynecology

## 2019-03-10 DIAGNOSIS — Z3400 Encounter for supervision of normal first pregnancy, unspecified trimester: Secondary | ICD-10-CM | POA: Diagnosis not present

## 2019-03-16 ENCOUNTER — Other Ambulatory Visit: Payer: Self-pay

## 2019-03-16 ENCOUNTER — Ambulatory Visit (INDEPENDENT_AMBULATORY_CARE_PROVIDER_SITE_OTHER): Payer: BLUE CROSS/BLUE SHIELD | Admitting: Obstetrics and Gynecology

## 2019-03-16 ENCOUNTER — Encounter: Payer: Self-pay | Admitting: General Practice

## 2019-03-16 VITALS — BP 115/72 | HR 106 | Temp 98.1°F | Wt 135.0 lb

## 2019-03-16 DIAGNOSIS — B3731 Acute candidiasis of vulva and vagina: Secondary | ICD-10-CM

## 2019-03-16 DIAGNOSIS — O26891 Other specified pregnancy related conditions, first trimester: Secondary | ICD-10-CM

## 2019-03-16 DIAGNOSIS — O210 Mild hyperemesis gravidarum: Secondary | ICD-10-CM

## 2019-03-16 DIAGNOSIS — Z124 Encounter for screening for malignant neoplasm of cervix: Secondary | ICD-10-CM | POA: Diagnosis not present

## 2019-03-16 DIAGNOSIS — B373 Candidiasis of vulva and vagina: Secondary | ICD-10-CM

## 2019-03-16 DIAGNOSIS — Z113 Encounter for screening for infections with a predominantly sexual mode of transmission: Secondary | ICD-10-CM | POA: Diagnosis not present

## 2019-03-16 DIAGNOSIS — N898 Other specified noninflammatory disorders of vagina: Secondary | ICD-10-CM | POA: Diagnosis not present

## 2019-03-16 DIAGNOSIS — Z3400 Encounter for supervision of normal first pregnancy, unspecified trimester: Secondary | ICD-10-CM

## 2019-03-16 DIAGNOSIS — Z3A12 12 weeks gestation of pregnancy: Secondary | ICD-10-CM

## 2019-03-16 MED ORDER — ONDANSETRON 8 MG PO TBDP
8.0000 mg | ORAL_TABLET | Freq: Three times a day (TID) | ORAL | 0 refills | Status: DC | PRN
Start: 2019-03-16 — End: 2019-09-15

## 2019-03-16 MED ORDER — TERCONAZOLE 0.4 % VA CREA: 1.0000 | TOPICAL_CREAM | Freq: Every day | VAGINAL | 0 refills | Status: DC

## 2019-03-16 MED ORDER — PROMETHAZINE HCL 25 MG PO TABS: 25.0000 mg | ORAL_TABLET | Freq: Four times a day (QID) | ORAL | 3 refills | Status: DC | PRN

## 2019-03-16 NOTE — Patient Instructions (Addendum)
Morning Sickness  Morning sickness is when a woman feels nauseous during pregnancy. This nauseous feeling may or may not come with vomiting. It often occurs in the morning, but it can be a problem at any time of day. Morning sickness is most common during the first trimester. In some cases, it may continue throughout pregnancy. Although morning sickness is unpleasant, it is usually harmless unless the woman develops severe and continual vomiting (hyperemesis gravidarum), a condition that requires more intense treatment. What are the causes? The exact cause of this condition is not known, but it seems to be related to normal hormonal changes that occur in pregnancy. What increases the risk? You are more likely to develop this condition if:  You experienced nausea or vomiting before your pregnancy.  You had morning sickness during a previous pregnancy.  You are pregnant with more than one baby, such as twins. What are the signs or symptoms? Symptoms of this condition include:  Nausea.  Vomiting. How is this diagnosed? This condition is usually diagnosed based on your signs and symptoms. How is this treated? In many cases, treatment is not needed for this condition. Making some changes to what you eat may help to control symptoms. Your health care provider may also prescribe or recommend:  Vitamin B6 supplements.  Anti-nausea medicines.  Ginger. Follow these instructions at home: Medicines  Take over-the-counter and prescription medicines only as told by your health care provider. Do not use any prescription, over-the-counter, or herbal medicines for morning sickness without first talking with your health care provider.  Taking multivitamins before getting pregnant can prevent or decrease the severity of morning sickness in most women. Eating and drinking  Eat a piece of dry toast or crackers before getting out of bed in the morning.  Eat 5 or 6 small meals a day.  Eat dry and  bland foods, such as rice or a baked potato. Foods that are high in carbohydrates are often helpful.  Avoid greasy, fatty, and spicy foods.  Have someone cook for you if the smell of any food causes nausea and vomiting.  If you feel nauseous after taking prenatal vitamins, take the vitamins at night or with a snack.  Snack on protein foods between meals if you are hungry. Nuts, yogurt, and cheese are good options.  Drink fluids throughout the day.  Try ginger ale made with real ginger, ginger tea made from fresh grated ginger, or ginger candies. General instructions  Do not use any products that contain nicotine or tobacco, such as cigarettes and e-cigarettes. If you need help quitting, ask your health care provider.  Get an air purifier to keep the air in your house free of odors.  Get plenty of fresh air.  Try to avoid odors that trigger your nausea.  Consider trying these methods to help relieve symptoms: ? Wearing an acupressure wristband. These wristbands are often worn for seasickness. ? Acupuncture. Contact a health care provider if:  Your home remedies are not working and you need medicine.  You feel dizzy or light-headed.  You are losing weight. Get help right away if:  You have persistent and uncontrolled nausea and vomiting.  You faint.  You have severe pain in your abdomen. Summary  Morning sickness is when a woman feels nauseous during pregnancy. This nauseous feeling may or may not come with vomiting.  Morning sickness is most common during the first trimester.  It often occurs in the morning, but it can be a problem at   any time of day.  In many cases, treatment is not needed for this condition. Making some changes to what you eat may help to control symptoms. This information is not intended to replace advice given to you by your health care provider. Make sure you discuss any questions you have with your health care provider. Document Released:  10/30/2006 Document Revised: 10/11/2016 Document Reviewed: 10/11/2016 Elsevier Interactive Patient Education  2019 [place] Screening Results Information: You are having genetic testing called [facility] today.  It will take approximately 2 weeks before the results are available.  To get your results, you need Internet access to a web browser to search [place]/MyChart (the direct app on your phone will not give you these results).  Then select Lab Scanned and click on the blue hyper link that says View Image to see your [facility] results.  You can also use the directions on the purple card given to look up your results directly on the [place] website.'

## 2019-03-16 NOTE — Progress Notes (Signed)
Subjective:    Chriss DriverDakenya Strayer is being seen today for her first obstetrical visit.  This is not a planned pregnancy. She is at 5683w1d gestation. Her obstetrical history is significant for none. Relationship with FOB: significant other, not living together. Patient does intend to breast feed. Pregnancy history fully reviewed.  Patient reports nausea, vaginal irritation and vomiting. She also report occ. Pelvic pressure.  Review of Systems:   Review of Systems  Constitutional: Negative.   HENT: Negative.   Eyes: Negative.   Respiratory: Negative.   Cardiovascular: Negative.   Gastrointestinal: Positive for nausea and vomiting (every morning).  Endocrine: Negative.   Genitourinary: Positive for vaginal discharge (clear and irritating).  Musculoskeletal: Negative.   Skin: Negative.   Allergic/Immunologic: Negative.   Neurological: Negative.   Hematological: Negative.   Psychiatric/Behavioral: Negative.     Objective:     BP 115/72   Pulse (!) 106   Temp 98.1 F (36.7 C)   Wt 135 lb (61.2 kg)   LMP 12/21/2018 (Exact Date)   BMI 23.91 kg/m  Physical Exam  Nursing note and vitals reviewed. Constitutional: She is oriented to person, place, and time. She appears well-developed and well-nourished.  HENT:  Head: Normocephalic and atraumatic.  Right Ear: External ear normal.  Left Ear: External ear normal.  Nose: Nose normal.  Mouth/Throat: Oropharynx is clear and moist.  Eyes: Pupils are equal, round, and reactive to light. Conjunctivae and EOM are normal.  Neck: Normal range of motion. Neck supple.  Cardiovascular: Normal rate, regular rhythm, normal heart sounds and intact distal pulses.  Respiratory: Effort normal and breath sounds normal.  GI: Soft. Bowel sounds are normal.  Genitourinary:    Vaginal discharge present.     Genitourinary Comments: Uterus: enlarged, S=D, SE: cervix is smooth, pink, no lesions, copious amt of thick, yellow, adherent, clumpy vaginal d/c --  WP, GC/CT done, closed/long/firm, no CMT or friability, no adnexal tenderness    Musculoskeletal: Normal range of motion.  Neurological: She is alert and oriented to person, place, and time. She has normal reflexes.  Skin: Skin is warm and dry.  Multiple tattoos over entire body  Psychiatric: She has a normal mood and affect. Her behavior is normal. Judgment and thought content normal.    Maternal Exam:  Abdomen: Patient reports no abdominal tenderness. Introitus: Vagina is positive for vaginal discharge.  Ferning test: not done.  Nitrazine test: not done. Amniotic fluid character: not assessed.  Pelvis: adequate for delivery.   Cervix: Cervix evaluated by sterile speculum exam and digital exam.     Fetal Exam Fetal Monitor Review: Mode: hand-held doppler probe.   Baseline rate: 159 bpm.       Assessment:    Pregnancy: G1P0 Patient Active Problem List   Diagnosis Date Noted  . Supervision of normal first pregnancy, antepartum 03/01/2019       Plan:   Supervision of normal first pregnancy, antepartum  - Obstetric Panel, Including HIV, Genetic Screening, Culture, OB Urine, - Cytology - PAP( [place]),  - Cervicovaginal ancillary only( [place]),  - Discussed the normal variations of pregnancy at this gestation will cause some pelvic pressure - Advised soaking in tub of warm water and can take Tylenol 1000 mg every 6 hrs prn pain OR could call office  - Rx for Zofran and Phenergan  - Anticipatory guidance for flu & TdaP vaccines offered later in pregnancy - [facility]  Pap smear for cervical cancer screening  - Cytology -  PAP( [place])  Screen for STD (sexually transmitted disease)  - Cervicovaginal ancillary only( [place]) - Rx for Terazol cream for yeast infection  Initial and [place] labs drawn. Prenatal vitamins. Problem list reviewed and updated. AFP3 discussed: requested. Role of ultrasound in pregnancy discussed;  fetal survey: ordered. Amniocentesis discussed: not indicated. The nature of [place] with multiple MDs and other Advanced Practice Providers was explained to patient; also emphasized that residents, students are part of our team.  Discussed optimized OB schedule and video visits. Advised can have an in-office visit whenever she feels she needs to be seen.  Advised to call during normal business hours and there is an after-hours nurse line available.  Follow up in 4 weeks for AFP and 8 wks for [facility] OB visit. 50% of 45 min visit spent on counseling and coordination of care.     Laury Deep, MSN, CNM 03/16/2019

## 2019-03-17 LAB — OBSTETRIC PANEL, INCLUDING HIV
Antibody Screen: NEGATIVE
Basophils Absolute: 0 10*3/uL (ref 0.0–0.2)
Basos: 0 %
EOS (ABSOLUTE): 0.1 10*3/uL (ref 0.0–0.4)
Eos: 1 %
HIV Screen 4th Generation wRfx: NONREACTIVE
Hematocrit: 37.1 % (ref 34.0–46.6)
Hemoglobin: 12.8 g/dL (ref 11.1–15.9)
Hepatitis B Surface Ag: NEGATIVE
Immature Grans (Abs): 0.1 10*3/uL (ref 0.0–0.1)
Immature Granulocytes: 1 %
Lymphocytes Absolute: 1.2 10*3/uL (ref 0.7–3.1)
Lymphs: 17 %
MCH: 32.5 pg (ref 26.6–33.0)
MCHC: 34.5 g/dL (ref 31.5–35.7)
MCV: 94 fL (ref 79–97)
Monocytes Absolute: 0.3 10*3/uL (ref 0.1–0.9)
Monocytes: 4 %
Neutrophils Absolute: 5.5 10*3/uL (ref 1.4–7.0)
Neutrophils: 77 %
Platelets: 303 10*3/uL (ref 150–450)
RBC: 3.94 x10E6/uL (ref 3.77–5.28)
RDW: 12.2 % (ref 11.7–15.4)
RPR Ser Ql: NONREACTIVE
Rh Factor: POSITIVE
Rubella Antibodies, IGG: 4.09 index (ref >0.99)
WBC: 7.1 10*3/uL (ref 3.4–10.8)

## 2019-03-18 LAB — CYTOLOGY - PAP: Diagnosis: NEGATIVE

## 2019-03-18 LAB — CERVICOVAGINAL ANCILLARY ONLY
Bacterial vaginitis: NEGATIVE
Candida vaginitis: POSITIVE — AB
Chlamydia: NEGATIVE
Neisseria Gonorrhea: NEGATIVE
Trichomonas: NEGATIVE

## 2019-03-19 LAB — CULTURE, OB URINE

## 2019-03-19 LAB — URINE CULTURE, OB REFLEX

## 2019-03-20 ENCOUNTER — Other Ambulatory Visit: Payer: Self-pay | Admitting: Obstetrics and Gynecology

## 2019-03-20 DIAGNOSIS — O2342 Unspecified infection of urinary tract in pregnancy, second trimester: Secondary | ICD-10-CM

## 2019-03-20 MED ORDER — CEFADROXIL 500 MG PO CAPS: 500.0000 mg | ORAL_CAPSULE | Freq: Two times a day (BID) | ORAL | 0 refills | Status: DC

## 2019-03-20 NOTE — Progress Notes (Signed)
TC to notify patient of (+) UCx results, Rx needed to treat. Pharmacy changed from [facility] to [facility] on [address] per pt request.  Laury Deep, CNM

## 2019-03-24 ENCOUNTER — Encounter: Payer: Self-pay | Admitting: General Practice

## 2019-03-28 ENCOUNTER — Encounter: Payer: Self-pay | Admitting: General Practice

## 2019-04-03 ENCOUNTER — Inpatient Hospital Stay (HOSPITAL_COMMUNITY)
Admission: AD | Admit: 2019-04-03 | Discharge: 2019-04-03 | Disposition: A | Discharge status: Home / Self Care | Payer: BLUE CROSS/BLUE SHIELD | Attending: Obstetrics & Gynecology | Admitting: Obstetrics & Gynecology

## 2019-04-03 ENCOUNTER — Other Ambulatory Visit: Payer: Self-pay

## 2019-04-03 ENCOUNTER — Encounter (HOSPITAL_COMMUNITY): Payer: Self-pay | Admitting: *Deleted

## 2019-04-03 DIAGNOSIS — O209 Hemorrhage in early pregnancy, unspecified: Secondary | ICD-10-CM | POA: Diagnosis present

## 2019-04-03 DIAGNOSIS — O208 Other hemorrhage in early pregnancy: Secondary | ICD-10-CM | POA: Diagnosis not present

## 2019-04-03 DIAGNOSIS — O4692 Antepartum hemorrhage, unspecified, second trimester: Secondary | ICD-10-CM

## 2019-04-03 DIAGNOSIS — N930 Postcoital and contact bleeding: Secondary | ICD-10-CM

## 2019-04-03 DIAGNOSIS — Z87891 Personal history of nicotine dependence: Secondary | ICD-10-CM | POA: Diagnosis not present

## 2019-04-03 DIAGNOSIS — Z3492 Encounter for supervision of normal pregnancy, unspecified, second trimester: Secondary | ICD-10-CM

## 2019-04-03 DIAGNOSIS — Z3A14 14 weeks gestation of pregnancy: Secondary | ICD-10-CM | POA: Insufficient documentation

## 2019-04-03 LAB — URINALYSIS, ROUTINE W REFLEX MICROSCOPIC
Bilirubin Urine: NEGATIVE
Glucose, UA: 50 mg/dL — AB
Ketones, ur: NEGATIVE mg/dL
Leukocytes,Ua: NEGATIVE
Nitrite: NEGATIVE
Protein, ur: 30 mg/dL — AB
Specific Gravity, Urine: 1.026 (ref 1.005–1.030)
pH: 7 (ref 5.0–8.0)

## 2019-04-03 NOTE — MAU Note (Signed)
Sharon Morris is a 23 y.o. at [redacted]w[redacted]d here in MAU reporting: states she woke up this AM with vaginal bleeding. States she saw blood in the toilet and then when she wiped the toilet paper was covered in the bleeding, no clots. States she is not currently wearing a pad. No abdominal pain. States she had IC yesterday.  Onset of complaint: this morning  Pain score: 0/10  Vitals:   04/03/19 0858  BP: (!) 112/59  Pulse: 77  Resp: 18  Temp: 98.8 F (37.1 C)  SpO2: 100%     Lab orders placed from triage: UA

## 2019-04-03 NOTE — MAU Provider Note (Signed)
Chief Complaint: Vaginal Bleeding   First Provider Initiated Contact with Patient 04/03/19 0946     SUBJECTIVE HPI: Sharon Morris is a 23 y.o. G1P0 at 5175w5d who presents to Maternity Admissions reporting vaginal bleeding. Reports bright red spotting on toilet paper this morning. Not bleeding into a pad or passing blood clots. Denies abdominal pain. Had intercourse last night.    Past Medical History:  Diagnosis Date  . Asthma   . Eczema   . Herpes    last outbreak in August   OB History  Gravida Para Term Preterm AB Living  1            SAB TAB Ectopic Multiple Live Births               # Outcome Date GA Lbr Len/2nd Weight Sex Delivery Anes PTL Lv  1 Current            Past Surgical History:  Procedure Laterality Date  . NO PAST SURGERIES     Social History   Socioeconomic History  . Marital status: Single    Spouse name: Not on file  . Number of children: Not on file  . Years of education: 12 grade  . Highest education level: High school graduate  Occupational History  . Not on file  Social Needs  . Financial resource strain: Not hard at all  . Food insecurity    Worry: Never true    Inability: Never true  . Transportation needs    Medical: No    Non-medical: No  Tobacco Use  . Smoking status: Former Smoker    Types: Cigars  . Smokeless tobacco: Never Used  Substance and Sexual Activity  . Alcohol use: Not Currently  . Drug use: Not Currently    Types: Marijuana  . Sexual activity: Yes    Birth control/protection: None  Lifestyle  . Physical activity    Days per week: 4 days    Minutes per session: 30 min  . Stress: To some extent  Relationships  . Social connections    Talks on phone: More than three times a week    Gets together: More than three times a week    Attends religious service: More than 4 times per year    Active member of club or organization: No    Attends meetings of clubs or organizations: Never    Relationship status: Never  married  . Intimate partner violence    Fear of current or ex partner: No    Emotionally abused: No    Physically abused: No    Forced sexual activity: No  Other Topics Concern  . Not on file  Social History Narrative  . Not on file   Family History  Problem Relation Age of Onset  . Hypertension Other   . Diabetes Other   . Cancer Other   . Stroke Other   . Hypertension Mother   . Diabetes Mother   . Hypertension Father    No current facility-administered medications on file prior to encounter.    Current Outpatient Medications on File Prior to Encounter  Medication Sig Dispense Refill  . albuterol (PROVENTIL HFA;VENTOLIN HFA) 108 (90 Base) MCG/ACT inhaler Inhale 1-2 puffs into the lungs every 6 (six) hours as needed for wheezing or shortness of breath. 1 Inhaler 0  . cetirizine (ZYRTEC) 10 MG tablet Take 10 mg by mouth daily as needed for allergies.     Marland Kitchen. ondansetron (ZOFRAN ODT) 8 MG  disintegrating tablet Take 1 tablet (8 mg total) by mouth every 8 (eight) hours as needed for nausea or vomiting. Use when phenergan is not working 30 tablet 0  . Prenatal Vit-Fe Phos-FA-Omega (VITAFOL GUMMIES) 3.33-0.333-34.8 MG CHEW Chew 3 each by mouth daily. 90 tablet 12  . promethazine (PHENERGAN) 25 MG tablet Take 1 tablet (25 mg total) by mouth every 6 (six) hours as needed for nausea or vomiting. 30 tablet 3  . triamcinolone (NASACORT) 55 MCG/ACT AERO nasal inhaler Place 2 sprays into the nose daily as needed (for rhinitis).     . triamcinolone ointment (KENALOG) 0.1 % Apply 1 application topically daily.  0  . valACYclovir (VALTREX) 500 MG tablet Take 500 mg by mouth 2 (two) times daily.     No Known Allergies  I have reviewed patient's Past Medical Hx, Surgical Hx, Family Hx, Social Hx, medications and allergies.   Review of Systems  Constitutional: Negative.   Gastrointestinal: Negative.   Genitourinary: Positive for vaginal bleeding.    OBJECTIVE Patient Vitals for the past 24  hrs:  BP Temp Temp src Pulse Resp SpO2 Height Weight  04/03/19 0957 109/67 - - 75 18 100 % - -  04/03/19 0858 (!) 112/59 98.8 F (37.1 C) Oral 77 18 100 % - -  04/03/19 0855 - - - - - - 5\' 3"  (1.6 m) 63 kg   Constitutional: Well-developed, well-nourished female in no acute distress.  Cardiovascular: normal rate & rhythm, no murmur Respiratory: normal rate and effort. Lung sounds clear throughout GI: Abd soft, non-tender, Pos BS x 4. No guarding or rebound tenderness MS: Extremities nontender, no edema, normal ROM Neurologic: Alert and oriented x 4.  GU:     SPECULUM EXAM: NEFG, minimal amount of dark red mucoid blood in vault. No active bleeding. Cervix pink/smooth.   BIMANUAL: No CMT. cervix closed/thick  LAB RESULTS Results for orders placed or performed during the hospital encounter of 04/03/19 (from the past 24 hour(s))  Urinalysis, Routine w reflex microscopic     Status: Abnormal   Collection Time: 04/03/19  9:14 AM  Result Value Ref Range   Color, Urine YELLOW YELLOW   APPearance CLOUDY (A) CLEAR   Specific Gravity, Urine 1.026 1.005 - 1.030   pH 7.0 5.0 - 8.0   Glucose, UA 50 (A) NEGATIVE mg/dL   Hgb urine dipstick MODERATE (A) NEGATIVE   Bilirubin Urine NEGATIVE NEGATIVE   Ketones, ur NEGATIVE NEGATIVE mg/dL   Protein, ur 30 (A) NEGATIVE mg/dL   Nitrite NEGATIVE NEGATIVE   Leukocytes,Ua NEGATIVE NEGATIVE   RBC / HPF 0-5 0 - 5 RBC/hpf   WBC, UA 0-5 0 - 5 WBC/hpf   Bacteria, UA RARE (A) NONE SEEN   Squamous Epithelial / LPF 11-20 0 - 5   Mucus PRESENT     IMAGING No results found.  MAU COURSE Orders Placed This Encounter  Procedures  . Urinalysis, Routine w reflex microscopic  . Discharge patient   No orders of the defined types were placed in this encounter.   MDM FHT present via doppler No active bleeding on exam Cervix closed/thick RH positive  ASSESSMENT 1. PCB (post coital bleeding)   2. [redacted] weeks gestation of pregnancy   3. Presence of fetal  heart sounds in second trimester     PLAN Discharge home in stable condition. Bleeding precautions Follow-up Information    [facility] Follow up.   Specialty: Obstetrics and Gynecology Why: return for worsening symptoms  Contact information: [address] [id number] [place] [id number] [phone]         Allergies as of 04/03/2019   No Known Allergies     Medication List    STOP taking these medications   cefadroxil 500 MG capsule Commonly known as: DURICEF   lidocaine 2 % solution Commonly known as: XYLOCAINE   predniSONE 20 MG tablet Commonly known as: DELTASONE     TAKE these medications   albuterol 108 (90 Base) MCG/ACT inhaler Commonly known as: VENTOLIN HFA Inhale 1-2 puffs into the lungs every 6 (six) hours as needed for wheezing or shortness of breath.   cetirizine 10 MG tablet Commonly known as: ZYRTEC Take 10 mg by mouth daily as needed for allergies.   ondansetron 8 MG disintegrating tablet Commonly known as: Zofran ODT Take 1 tablet (8 mg total) by mouth every 8 (eight) hours as needed for nausea or vomiting. Use when phenergan is not working   promethazine 25 MG tablet Commonly known as: PHENERGAN Take 1 tablet (25 mg total) by mouth every 6 (six) hours as needed for nausea or vomiting.   triamcinolone 55 MCG/ACT Aero nasal inhaler Commonly known as: NASACORT Place 2 sprays into the nose daily as needed (for rhinitis).   triamcinolone ointment 0.1 % Commonly known as: KENALOG Apply 1 application topically daily.   valACYclovir 500 MG tablet Commonly known as: VALTREX Take 500 mg by mouth 2 (two) times daily.   Vitafol Gummies 3.33-0.333-34.8 MG Chew Chew 3 each by mouth daily.        Judeth HornLawrence, Anushree Dorsi, NP 04/03/2019  4:07 PM

## 2019-04-03 NOTE — Discharge Instructions (Signed)
Vaginal Bleeding During Pregnancy, Second Trimester ° °A small amount of bleeding (spotting) from the vagina is relatively common during pregnancy. It usually stops on its own. Various things can cause spotting during pregnancy. Sometimes the bleeding is normal and is not a sign of a problem in the pregnancy. However, bleeding can also be a sign of something serious. Be sure to tell your health care provider about any vaginal bleeding right away. °Some possible causes of vaginal bleeding during the second trimester include: °· Infection, inflammation, or growths (polyps) on the cervix. °· A condition in which the placenta partially or completely covers the opening of the cervix inside the uterus (placenta previa). °· The placenta separating from the uterus (placenta abruption). °· Early (preterm) labor. °· The cervix opening and thinning before pregnancy is at term and before labor starts (cervical insufficiency). °· A mass of tissue developing in the uterus due to an egg being fertilized incorrectly (molar pregnancy). °Follow these instructions at home: °Activity °· Follow instructions from your health care provider about limiting your activity. Ask what activities are safe for you. °· If needed, make plans for someone to help with your regular activities. °· Do not exercise or do activities that take a lot of effort unless your health care provider approves. °· Do not lift anything that is heavier than 10 lb (4.5 kg), or the limit that your health care provider tells you, until he or she says that it is safe. °· Do not have sex or orgasms until your health care provider says that this is safe. °Medicines °· Take over-the-counter and prescription medicines only as told by your health care provider. °· Do not take aspirin because it can cause bleeding. °General instructions °· Pay attention to any changes in your symptoms. °· Write down how many pads you use each day, how often you change pads, and how soaked  (saturated) they are. °· Do not use tampons or douche. °· If you pass any tissue from your vagina, save the tissue so you can show it to your health care provider. °· Keep all follow-up visits as told by your health care provider. This is important. °Contact a health care provider if: °· You have vaginal bleeding during any time of your pregnancy. °· You have cramps or labor pains. °· You have a fever that does not get better when you take medicines. °Get help right away if: °· You have severe cramps in your back or abdomen. °· You have contractions. °· You have chills. °· You pass large clots or a large amount of tissue from your vagina. °· Your bleeding increases. °· You feel light-headed or weak, or you faint. °· You are leaking fluid or have a gush of fluid from your vagina. °Summary °· Various things can cause bleeding or spotting in pregnancy. °· Be sure to tell your health care provider about any vaginal bleeding right away. °· Follow instructions from your health care provider about limiting your activity. Ask what activities are safe for you. °This information is not intended to replace advice given to you by your health care provider. Make sure you discuss any questions you have with your health care provider. °Document Released: 06/18/2005 Document Revised: 12/28/2018 Document Reviewed: 12/11/2016 °Elsevier Patient Education © 2020 [employer] Inc[employer] ° °

## 2019-04-12 ENCOUNTER — Other Ambulatory Visit (INDEPENDENT_AMBULATORY_CARE_PROVIDER_SITE_OTHER): Payer: BLUE CROSS/BLUE SHIELD | Admitting: *Deleted

## 2019-04-12 ENCOUNTER — Other Ambulatory Visit: Payer: Self-pay

## 2019-04-12 DIAGNOSIS — Z3400 Encounter for supervision of normal first pregnancy, unspecified trimester: Secondary | ICD-10-CM

## 2019-04-12 NOTE — Progress Notes (Signed)
   Patient in clinic for AFP lab only.  Derl Barrow, RN

## 2019-04-14 LAB — AFP TETRA
DIA Mom Value: 0.91
DIA Value (EIA): 161.84 pg/mL
DSR (By Age)    1 IN: 1128
DSR (Second Trimester) 1 IN: 1279
Gestational Age: 16 WEEKS
MSAFP Mom: 0.82
MSAFP: 30.8 ng/mL
MSHCG Mom: 1.17
MSHCG: 52978 m[IU]/mL
Maternal Age At EDD: 22 yr
Osb Risk: 10000
T18 (By Age): 1:4395 {titer}
Test Results:: NEGATIVE
Weight: 140 [lb_av]
uE3 Mom: 0.58
uE3 Value: 0.53 ng/mL

## 2019-05-03 ENCOUNTER — Other Ambulatory Visit: Payer: Self-pay | Admitting: Obstetrics and Gynecology

## 2019-05-03 ENCOUNTER — Other Ambulatory Visit: Payer: Self-pay

## 2019-05-03 ENCOUNTER — Ambulatory Visit (HOSPITAL_COMMUNITY)
Admission: RE | Admit: 2019-05-03 | Discharge: 2019-05-03 | Disposition: A | Discharge status: Home / Self Care | Payer: BLUE CROSS/BLUE SHIELD | Source: Ambulatory Visit | Attending: Obstetrics and Gynecology | Admitting: Obstetrics and Gynecology

## 2019-05-03 ENCOUNTER — Other Ambulatory Visit: Payer: Self-pay | Admitting: *Deleted

## 2019-05-03 DIAGNOSIS — B373 Candidiasis of vulva and vagina: Secondary | ICD-10-CM

## 2019-05-03 DIAGNOSIS — Z3400 Encounter for supervision of normal first pregnancy, unspecified trimester: Secondary | ICD-10-CM

## 2019-05-03 DIAGNOSIS — Z363 Encounter for antenatal screening for malformations: Secondary | ICD-10-CM | POA: Diagnosis not present

## 2019-05-03 DIAGNOSIS — B3731 Acute candidiasis of vulva and vagina: Secondary | ICD-10-CM

## 2019-05-03 DIAGNOSIS — Z3A19 19 weeks gestation of pregnancy: Secondary | ICD-10-CM

## 2019-05-03 DIAGNOSIS — O358XX0 Maternal care for other (suspected) fetal abnormality and damage, not applicable or unspecified: Secondary | ICD-10-CM | POA: Diagnosis not present

## 2019-05-03 DIAGNOSIS — B009 Herpesviral infection, unspecified: Secondary | ICD-10-CM

## 2019-05-03 MED ORDER — TERCONAZOLE 0.4 % VA CREA: 1.0000 | TOPICAL_CREAM | Freq: Every day | VAGINAL | 0 refills | Status: AC

## 2019-05-04 MED ORDER — VALACYCLOVIR HCL 500 MG PO TABS: 500.0000 mg | ORAL_TABLET | Freq: Two times a day (BID) | ORAL | 0 refills | Status: AC

## 2019-05-05 ENCOUNTER — Telehealth: Payer: Self-pay | Admitting: General Practice

## 2019-05-05 NOTE — Telephone Encounter (Signed)
Patient called and stated that she had been hurting for 3 days and now she is bleeding.  Advised patient to visit MAU for further evaluation ASAP.  Pt verbalized understanding.

## 2019-05-12 ENCOUNTER — Encounter: Payer: Self-pay | Admitting: General Practice

## 2019-05-12 ENCOUNTER — Telehealth (INDEPENDENT_AMBULATORY_CARE_PROVIDER_SITE_OTHER): Payer: BLUE CROSS/BLUE SHIELD | Admitting: Obstetrics and Gynecology

## 2019-05-12 DIAGNOSIS — Z3A20 20 weeks gestation of pregnancy: Secondary | ICD-10-CM

## 2019-05-12 DIAGNOSIS — Z3400 Encounter for supervision of normal first pregnancy, unspecified trimester: Secondary | ICD-10-CM

## 2019-05-12 DIAGNOSIS — Z3402 Encounter for supervision of normal first pregnancy, second trimester: Secondary | ICD-10-CM

## 2019-05-12 NOTE — Progress Notes (Signed)
   MY CHART VIDEO VIRTUAL OBSTETRICS VISIT ENCOUNTER NOTE  I connected with Sharon Morris on 05/14/19 at  3:30 PM EDT by My Chart video at home and verified that I am speaking with the correct person using two identifiers.   I discussed the limitations, risks, security and privacy concerns of performing an evaluation and management service by My Chart video and the availability of in person appointments. I also discussed with the patient that there may be a patient responsible charge related to this service. The patient expressed understanding and agreed to proceed.  Subjective:  Sharon Morris is a 23 y.o. G1P0 at [redacted]w[redacted]d being followed for ongoing prenatal care.  She is currently monitored for the following issues for this low-risk pregnancy and has Supervision of normal first pregnancy, antepartum on their problem list.  Patient reports swelling of feet. She works for [employer] and is on her feet for at least 9-10 hours/shift. She requests a note to take breaks at work. She was seen in MAU for postcoital vaginal bleeding, but no complaints of that now. Reports fetal movement. Denies any contractions, bleeding or leaking of fluid.   The following portions of the patient's history were reviewed and updated as appropriate: allergies, current medications, past family history, past medical history, past social history, past surgical history and problem list.   Objective:   General:  Alert, oriented and cooperative.   Mental Status: Normal mood and affect perceived. Normal judgment and thought content.  Rest of physical exam deferred due to type of encounter  LMP 12/21/2018 (Exact Date)  **Done by patient's own at home BP cuff and scale  Assessment and Plan:  Pregnancy: G1P0 at [redacted]w[redacted]d  1. Supervision of normal first pregnancy, antepartum - Encouraged to drink at least 10-12 water bottles daily, take frequent rest breaks at work - Note to have frequent breaks while at work - Anticipatory guidance  for next OB visit via [facility]  Preterm labor symptoms and general obstetric precautions including but not limited to vaginal bleeding, contractions, leaking of fluid and fetal movement were reviewed in detail with the patient.  I discussed the assessment and treatment plan with the patient. The patient was provided an opportunity to ask questions and all were answered. The patient agreed with the plan and demonstrated an understanding of the instructions. The patient was advised to call back or seek an in-person office evaluation/go to MAU at [facility] for any urgent or concerning symptoms. Please refer to After Visit Summary for other counseling recommendations.   I provided 10 minutes of non-face-to-face time during this encounter. There was 5 minutes of chart review time spent prior to this encounter. Total time spent = 15 minutes.  Return in about 4 weeks (around 06/09/2019) for Return OB - My Chart video.  Future Appointments  Date Time Provider [place]  06/09/2019  2:50 PM Laury Deep, CNM [facility] None  07/07/2019  8:10 AM Laury Deep, CNM [facility] None    Laury Deep, [place] for [employer], [place]

## 2019-05-14 ENCOUNTER — Encounter: Payer: Self-pay | Admitting: Obstetrics and Gynecology

## 2019-06-09 ENCOUNTER — Other Ambulatory Visit: Payer: Self-pay

## 2019-06-09 ENCOUNTER — Encounter: Payer: Self-pay | Admitting: Obstetrics and Gynecology

## 2019-06-09 ENCOUNTER — Telehealth (INDEPENDENT_AMBULATORY_CARE_PROVIDER_SITE_OTHER): Payer: BLUE CROSS/BLUE SHIELD | Admitting: Obstetrics and Gynecology

## 2019-06-09 DIAGNOSIS — Z3402 Encounter for supervision of normal first pregnancy, second trimester: Secondary | ICD-10-CM

## 2019-06-09 DIAGNOSIS — Z3400 Encounter for supervision of normal first pregnancy, unspecified trimester: Secondary | ICD-10-CM

## 2019-06-09 DIAGNOSIS — Z3A24 24 weeks gestation of pregnancy: Secondary | ICD-10-CM

## 2019-06-09 NOTE — Patient Instructions (Signed)
Glucose Tolerance Test During Pregnancy Why am I having this test? The glucose tolerance test (GTT) is done to check how your body processes sugar (glucose). This is one of several tests used to diagnose diabetes that develops during pregnancy (gestational diabetes mellitus). Gestational diabetes is a temporary form of diabetes that some women develop during pregnancy. It usually occurs during the second trimester of pregnancy and goes away after delivery. Testing (screening) for gestational diabetes usually occurs between 24 and 28 weeks of pregnancy. You may have the GTT test after having a 1-hour glucose screening test if the results from that test indicate that you may have gestational diabetes. You may also have this test if:  You have a history of gestational diabetes.  You have a history of giving birth to very large babies or have experienced repeated fetal loss (stillbirth).  You have signs and symptoms of diabetes, such as: ? Changes in your vision. ? Tingling or numbness in your hands or feet. ? Changes in hunger, thirst, and urination that are not otherwise explained by your pregnancy. What is being tested? This test measures the amount of glucose in your blood at different times during a period of 3 hours. This indicates how well your body is able to process glucose. What kind of sample is taken?  Blood samples are required for this test. They are usually collected by inserting a needle into a blood vessel. How do I prepare for this test?  For 3 days before your test, eat normally. Have plenty of carbohydrate-rich foods.  Follow instructions from your health care provider about: ? Eating or drinking restrictions on the day of the test. You may be asked to not eat or drink anything other than water (fast) starting 8-10 hours before the test. ? Changing or stopping your regular medicines. Some medicines may interfere with this test. Tell a health care provider about:  All  medicines you are taking, including vitamins, herbs, eye drops, creams, and over-the-counter medicines.  Any blood disorders you have.  Any surgeries you have had.  Any medical conditions you have. What happens during the test? First, your blood glucose will be measured. This is referred to as your fasting blood glucose, since you fasted before the test. Then, you will drink a glucose solution that contains a certain amount of glucose. Your blood glucose will be measured again 1, 2, and 3 hours after drinking the solution. This test takes about 3 hours to complete. You will need to stay at the testing location during this time. During the testing period:  Do not eat or drink anything other than the glucose solution.  Do not exercise.  Do not use any products that contain nicotine or tobacco, such as cigarettes and e-cigarettes. If you need help stopping, ask your health care provider. The testing procedure may vary among health care providers and hospitals. How are the results reported? Your results will be reported as milligrams of glucose per deciliter of blood (mg/dL) or millimoles per liter (mmol/L). Your health care provider will compare your results to normal ranges that were established after testing a large group of people (reference ranges). Reference ranges may vary among labs and hospitals. For this test, common reference ranges are:  Fasting: less than 95-105 mg/dL (5.3-5.8 mmol/L).  1 hour after drinking glucose: less than 180-190 mg/dL (10.0-10.5 mmol/L).  2 hours after drinking glucose: less than 155-165 mg/dL (8.6-9.2 mmol/L).  3 hours after drinking glucose: 140-145 mg/dL (7.8-8.1 mmol/L). What do the   results mean? Results within reference ranges are considered normal, meaning that your glucose levels are well-controlled. If two or more of your blood glucose levels are high, you may be diagnosed with gestational diabetes. If only one level is high, your health care  provider may suggest repeat testing or other tests to confirm a diagnosis. Talk with your health care provider about what your results mean. Questions to ask your health care provider Ask your health care provider, or the department that is doing the test:  When will my results be ready?  How will I get my results?  What are my treatment options?  What other tests do I need?  What are my next steps? Summary  The glucose tolerance test (GTT) is one of several tests used to diagnose diabetes that develops during pregnancy (gestational diabetes mellitus). Gestational diabetes is a temporary form of diabetes that some women develop during pregnancy.  You may have the GTT test after having a 1-hour glucose screening test if the results from that test indicate that you may have gestational diabetes. You may also have this test if you have any symptoms or risk factors for gestational diabetes.  Talk with your health care provider about what your results mean. This information is not intended to replace advice given to you by your health care provider. Make sure you discuss any questions you have with your health care provider. Document Released: 03/09/2012 Document Revised: 12/30/2018 Document Reviewed: 04/20/2017 Elsevier Patient Education  2020 Elsevier Inc.  

## 2019-06-09 NOTE — Progress Notes (Signed)
   MY CHART VIDEO VIRTUAL OBSTETRICS VISIT ENCOUNTER NOTE  I connected with Sharon Morris on 06/09/19 at  2:50 PM EDT by My Chart video at home and verified that I am speaking with the correct person using two identifiers.   I discussed the limitations, risks, security and privacy concerns of performing an evaluation and management service by My Chart video and the availability of in person appointments. I also discussed with the patient that there may be a patient responsible charge related to this service. The patient expressed understanding and agreed to proceed.  Subjective:  Sharon Morris is a 23 y.o. G1P0 at [redacted]w[redacted]d being followed for ongoing prenatal care.  She is currently monitored for the following issues for this low-risk pregnancy and has Supervision of normal first pregnancy, antepartum on their problem list.  Patient reports headache and black spots before eyes and swelling feet 3 days a week ( the days she has to work). She works for [employer] is on her feet a lot. Her job has modified her schedule from working (4) 10 hr days to working (3) 7 hr days. She reports drinking about 60+ ounces of water everyday. She is just unable to elevate her feet d/t her job. She does not have these sxs when she is not working. Reports fetal movement. Denies any contractions, bleeding or leaking of fluid.   The following portions of the patient's history were reviewed and updated as appropriate: allergies, current medications, past family history, past medical history, past social history, past surgical history and problem list.   Objective:   General:  Alert, oriented and cooperative.   Mental Status: Normal mood and affect perceived. Normal judgment and thought content.  Rest of physical exam deferred due to type of encounter  Wt 157 lb (71.2 kg)   LMP 12/21/2018 (Exact Date)   BMI 27.81 kg/m  **Done by patient's own at home scale -- BP cuff has not been received yet  Assessment and Plan:   Pregnancy: G1P0 at [redacted]w[redacted]d  1. Supervision of normal first pregnancy, antepartum - Patient coming to office tomorrow 9/18 for BP check - With c/o swelling feet, black spots before eyes and HA (only on the days she works), will do PEC labs tomorrow for baseline - Anticipatory guidance for 2hr GTT nv  Preterm labor symptoms and general obstetric precautions including but not limited to vaginal bleeding, contractions, leaking of fluid and fetal movement were reviewed in detail with the patient.  I discussed the assessment and treatment plan with the patient. The patient was provided an opportunity to ask questions and all were answered. The patient agreed with the plan and demonstrated an understanding of the instructions. The patient was advised to call back or seek an in-person office evaluation/go to [facility] at [facility] for any urgent or concerning symptoms. Please refer to After Visit Summary for other counseling recommendations.   I provided 10 minutes of non-face-to-face time during this encounter. There was 5 minutes of chart review time spent prior to this encounter. Total time spent = 15 minutes.  Return in about 4 weeks (around 07/07/2019) for Return OB 2hr GTT.  Future Appointments  Date Time Provider [place]  06/10/2019 11:00 AM [place] None  07/07/2019  8:10 AM Laury Deep, CNM [facility] None    Laury Deep, [place] for [employer] [place] [employer]

## 2019-06-10 ENCOUNTER — Ambulatory Visit: Payer: BLUE CROSS/BLUE SHIELD

## 2019-06-21 ENCOUNTER — Ambulatory Visit (INDEPENDENT_AMBULATORY_CARE_PROVIDER_SITE_OTHER): Payer: BLUE CROSS/BLUE SHIELD | Admitting: *Deleted

## 2019-06-21 ENCOUNTER — Other Ambulatory Visit: Payer: Self-pay

## 2019-06-21 VITALS — BP 108/75 | HR 56 | Temp 97.7°F | Ht 63.0 in | Wt 160.0 lb

## 2019-06-21 DIAGNOSIS — Z0130 Encounter for examination of blood pressure without abnormal findings: Secondary | ICD-10-CM

## 2019-06-21 NOTE — Progress Notes (Signed)
   Subjective:  Shalon Salado is a 23 y.o. female here for BP check.   Hypertension ROS: patient does not perform home BP monitoring, no TIA's, no chest pain on exertion, no dyspnea on exertion, no swelling of ankles, no orthostatic dizziness or lightheadedness, no orthopnea or paroxysmal nocturnal dyspnea and no palpitations.    Objective:  BP 108/75 (BP Location: Right Arm, Patient Position: Sitting, Cuff Size: Normal)   Pulse (!) 56   Temp 97.7 F (36.5 C) (Oral)   Ht 5\' 3"  (1.6 m)   Wt 160 lb (72.6 kg)   LMP 12/21/2018 (Exact Date)   BMI 28.34 kg/m   Appearance alert, well appearing, and in no distress, oriented to person, place, and time and normal appearing weight. General exam BP noted to be well controlled today in office.    Assessment:   Blood Pressure well controlled and asymptomatic.   Plan:  Current treatment plan is effective, no change in therapy.   Derl Barrow, RN

## 2019-07-01 ENCOUNTER — Other Ambulatory Visit: Payer: Self-pay

## 2019-07-01 ENCOUNTER — Other Ambulatory Visit (HOSPITAL_COMMUNITY)
Admission: RE | Admit: 2019-07-01 | Discharge: 2019-07-01 | Disposition: A | Discharge status: Home / Self Care | Payer: Medicaid Other | Source: Ambulatory Visit | Attending: Obstetrics and Gynecology | Admitting: Obstetrics and Gynecology

## 2019-07-01 ENCOUNTER — Encounter: Payer: Self-pay | Admitting: Family

## 2019-07-01 ENCOUNTER — Ambulatory Visit (INDEPENDENT_AMBULATORY_CARE_PROVIDER_SITE_OTHER): Payer: Medicaid Other | Admitting: Family

## 2019-07-01 VITALS — BP 113/68 | HR 99 | Temp 98.7°F | Wt 161.0 lb

## 2019-07-01 DIAGNOSIS — Z3A27 27 weeks gestation of pregnancy: Secondary | ICD-10-CM | POA: Diagnosis not present

## 2019-07-01 DIAGNOSIS — Z3402 Encounter for supervision of normal first pregnancy, second trimester: Secondary | ICD-10-CM | POA: Diagnosis not present

## 2019-07-01 DIAGNOSIS — O26899 Other specified pregnancy related conditions, unspecified trimester: Secondary | ICD-10-CM | POA: Diagnosis present

## 2019-07-01 DIAGNOSIS — O99012 Anemia complicating pregnancy, second trimester: Secondary | ICD-10-CM

## 2019-07-01 DIAGNOSIS — O98519 Other viral diseases complicating pregnancy, unspecified trimester: Secondary | ICD-10-CM | POA: Insufficient documentation

## 2019-07-01 DIAGNOSIS — N898 Other specified noninflammatory disorders of vagina: Secondary | ICD-10-CM | POA: Diagnosis present

## 2019-07-01 DIAGNOSIS — O99019 Anemia complicating pregnancy, unspecified trimester: Secondary | ICD-10-CM

## 2019-07-01 DIAGNOSIS — Z3400 Encounter for supervision of normal first pregnancy, unspecified trimester: Secondary | ICD-10-CM

## 2019-07-01 DIAGNOSIS — B009 Herpesviral infection, unspecified: Secondary | ICD-10-CM | POA: Insufficient documentation

## 2019-07-01 DIAGNOSIS — O26892 Other specified pregnancy related conditions, second trimester: Secondary | ICD-10-CM

## 2019-07-01 MED ORDER — TERCONAZOLE 0.4 % VA CREA: 1.0000 | TOPICAL_CREAM | Freq: Every day | VAGINAL | 0 refills | Status: DC

## 2019-07-01 NOTE — Progress Notes (Signed)
   PRENATAL VISIT NOTE  Subjective:  Sharon Morris is a 23 y.o. G1P0 at [redacted]w[redacted]d being seen today for ongoing prenatal care.  She is currently monitored for the following issues for this low-risk pregnancy and has Supervision of normal first pregnancy, antepartum on their problem list.  Patient reports vaginal irritation.  Reports white discharge every month. Denies lesions. Contractions: Not present. Vag. Bleeding: None.  Movement: Present. Denies leaking of fluid.   The following portions of the patient's history were reviewed and updated as appropriate: allergies, current medications, past family history, past medical history, past social history, past surgical history and problem list.   Objective:   Vitals:   07/01/19 0826  BP: 113/68  Pulse: 99  Temp: 98.7 F (37.1 C)  Weight: 161 lb (73 kg)    Fetal Status: Fetal Heart Rate (bpm): 133 Fundal Height: 28 cm Movement: Present     General:  Alert, oriented and cooperative. Patient is in no acute distress.  Skin: Skin is warm and dry. No rash noted.   Cardiovascular: Normal heart rate noted  Respiratory: Normal respiratory effort, no problems with respiration noted  Abdomen: Soft, gravid, appropriate for gestational age.  Pain/Pressure: Present     Pelvic: Cervical exam deferred      ; white discharge seen on vaginal area, no lesions  Extremities: Normal range of motion.  Edema: None  Mental Status: Normal mood and affect. Normal behavior. Normal judgment and thought content.   Assessment and Plan:  Pregnancy: G1P0 at [redacted]w[redacted]d 1. Supervision of normal first pregnancy, antepartum - Glucose Tolerance, 2 Hours w/1 Hour - HIV Antibody (routine testing w rflx) - RPR - CBC - Given info on childbirth classes - Blood pressure cuff ordered  2. Vaginal discharge during pregnancy, antepartum - Terazol - Cervicovaginal ancillary only( [place])  Preterm labor symptoms and general obstetric precautions including but not limited to  vaginal bleeding, contractions, leaking of fluid and fetal movement were reviewed in detail with the patient. Please refer to After Visit Summary for other counseling recommendations.   Return in about 2 weeks (around 07/15/2019) for Telehealth Visit - Normal OB.  No future appointments.  Venia Carbon Karmen Stabs, CNM

## 2019-07-01 NOTE — Patient Instructions (Signed)
Please call [phone] or [phone] to schedule childbirth education classes or visit [email or web address] for a description of the classes.

## 2019-07-02 ENCOUNTER — Encounter: Payer: Self-pay | Admitting: General Practice

## 2019-07-02 LAB — RPR: RPR Ser Ql: NONREACTIVE

## 2019-07-02 LAB — CBC
Hematocrit: 28.2 % — ABNORMAL LOW (ref 34.0–46.6)
Hemoglobin: 9.6 g/dL — ABNORMAL LOW (ref 11.1–15.9)
MCH: 30.8 pg (ref 26.6–33.0)
MCHC: 34 g/dL (ref 31.5–35.7)
MCV: 90 fL (ref 79–97)
Platelets: 303 10*3/uL (ref 150–450)
RBC: 3.12 x10E6/uL — ABNORMAL LOW (ref 3.77–5.28)
RDW: 11.8 % (ref 11.7–15.4)
WBC: 5.9 10*3/uL (ref 3.4–10.8)

## 2019-07-02 LAB — HIV ANTIBODY (ROUTINE TESTING W REFLEX): HIV Screen 4th Generation wRfx: NONREACTIVE

## 2019-07-02 LAB — GLUCOSE TOLERANCE, 2 HOURS W/ 1HR
Glucose, 1 hour: 137 mg/dL (ref 65–179)
Glucose, 2 hour: 81 mg/dL (ref 65–152)
Glucose, Fasting: 71 mg/dL (ref 65–91)

## 2019-07-07 ENCOUNTER — Encounter: Payer: BLUE CROSS/BLUE SHIELD | Admitting: Obstetrics and Gynecology

## 2019-07-07 ENCOUNTER — Encounter: Payer: Self-pay | Admitting: General Practice

## 2019-07-11 ENCOUNTER — Encounter: Payer: Self-pay | Admitting: *Deleted

## 2019-07-11 DIAGNOSIS — O99019 Anemia complicating pregnancy, unspecified trimester: Secondary | ICD-10-CM | POA: Insufficient documentation

## 2019-07-11 LAB — CERVICOVAGINAL ANCILLARY ONLY
Bacterial Vaginitis (gardnerella): NEGATIVE
Candida Glabrata: NEGATIVE
Candida Vaginitis: POSITIVE — AB
Chlamydia: NEGATIVE
Comment: NEGATIVE
Comment: NEGATIVE
Comment: NEGATIVE
Comment: NEGATIVE
Comment: NEGATIVE
Comment: NORMAL
Neisseria Gonorrhea: NEGATIVE
Trichomonas: NEGATIVE

## 2019-07-11 MED ORDER — FERROUS SULFATE 325 (65 FE) MG PO TABS: 325.0000 mg | ORAL_TABLET | Freq: Two times a day (BID) | ORAL | 1 refills | Status: DC

## 2019-07-11 NOTE — Addendum Note (Signed)
Addended by: Cyndee Brightly on: 07/11/2019 09:16 AM   Modules accepted: Orders

## 2019-07-11 NOTE — Progress Notes (Signed)
RX for ferrous sulfate sent to pharmacy for low hemoglobin.  Patient notified via [place].

## 2019-07-15 ENCOUNTER — Other Ambulatory Visit: Payer: Self-pay

## 2019-07-15 ENCOUNTER — Ambulatory Visit (INDEPENDENT_AMBULATORY_CARE_PROVIDER_SITE_OTHER): Payer: Medicaid Other | Admitting: Family

## 2019-07-15 DIAGNOSIS — Z3A29 29 weeks gestation of pregnancy: Secondary | ICD-10-CM | POA: Diagnosis not present

## 2019-07-15 DIAGNOSIS — Z3400 Encounter for supervision of normal first pregnancy, unspecified trimester: Secondary | ICD-10-CM

## 2019-07-15 DIAGNOSIS — O26893 Other specified pregnancy related conditions, third trimester: Secondary | ICD-10-CM

## 2019-07-15 DIAGNOSIS — R519 Headache, unspecified: Secondary | ICD-10-CM

## 2019-07-15 NOTE — Progress Notes (Signed)
I connected with@ on 07/15/19 at  8:50 AM EDT by: telephone due to phone screen being cracked and no video capability and verified that I am speaking with the correct person using two identifiers.  Patient is located at home and provider is located at office.     The purpose of this virtual visit is to provide medical care while limiting exposure to the novel coronavirus. I discussed the limitations, risks, security and privacy concerns of performing an evaluation and management service by telephone and the availability of in person appointments. I also discussed with the patient that there may be a patient responsible charge related to this service. By engaging in this virtual visit, you consent to the provision of healthcare.  Additionally, you authorize for your insurance to be billed for the services provided during this visit.  The patient expressed understanding and agreed to proceed.  The following staff members participated in the virtual visit:  Zion Ta karim-Rhoades and [place] VISIT NOTE  Subjective:  Sharon Morris is a 23 y.o. G1P0 at [redacted]w[redacted]d  for phone visit for ongoing prenatal care.  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  Patient reports headaches intermittenly; reports having cuff, but not with her at this time.  Plans to come to office today for blood pressure check and pick up cuff.  .   . Denies leaking of fluid.   The following portions of the patient's history were reviewed and updated as appropriate: allergies, current medications, past family history, past medical history, past social history, past surgical history and problem list.   Objective:  There were no vitals filed for this visit. Self-Obtained  Fetal Status:            Assessment and Plan:  Pregnancy: G1P0 at [redacted]w[redacted]d 1. Supervision of normal first pregnancy, antepartum - Reviewed ob labs with patient - Emphasized importance of blood pressure obtaining blood pressure results with pregnancy - Will plan to come to office for blood pressure cuff today and obtain blood pressure reading  2.  Headache in Pregnancy - Preeclampsia unlikely due to gestational age, however due to lack of blood pressure readings advised to come to office for BP check  Preterm labor symptoms and general obstetric precautions including but not limited to vaginal bleeding, contractions, leaking of fluid and fetal movement were reviewed in detail with the patient.  No follow-ups on file.  No future appointments.   Time spent on virtual visit: 10 minutes  Mayzie Caughlin N Karim-Rhoades, CNM   Addendum: Pt did not show for BP check and to pick up cuff, will call to advise to come in for BP check on Monday.   Cyndee Brightly, CNM

## 2019-07-19 ENCOUNTER — Telehealth: Payer: Self-pay | Admitting: General Practice

## 2019-07-19 NOTE — Telephone Encounter (Signed)
Per JEHUDJS, pt need a BP check.  Called patient but not answer and was unable to leave a VM message due VM is full.  Mychart message was sent to pt from Latina Craver, RN yesterday, but pt did not respond.  However, she did read the message.  Will try to contact patient later.

## 2019-09-01 ENCOUNTER — Encounter: Payer: Self-pay | Admitting: General Practice

## 2019-09-02 ENCOUNTER — Ambulatory Visit (INDEPENDENT_AMBULATORY_CARE_PROVIDER_SITE_OTHER): Payer: Medicaid Other | Admitting: Advanced Practice Midwife

## 2019-09-02 ENCOUNTER — Other Ambulatory Visit (HOSPITAL_COMMUNITY)
Admission: RE | Admit: 2019-09-02 | Discharge: 2019-09-02 | Disposition: A | Discharge status: Home / Self Care | Payer: Medicaid Other | Source: Ambulatory Visit | Attending: Advanced Practice Midwife | Admitting: Advanced Practice Midwife

## 2019-09-02 ENCOUNTER — Encounter: Payer: Self-pay | Admitting: Advanced Practice Midwife

## 2019-09-02 ENCOUNTER — Other Ambulatory Visit: Payer: Self-pay

## 2019-09-02 VITALS — BP 117/80 | HR 72 | Wt 179.2 lb

## 2019-09-02 DIAGNOSIS — Z3A36 36 weeks gestation of pregnancy: Secondary | ICD-10-CM

## 2019-09-02 DIAGNOSIS — Z3493 Encounter for supervision of normal pregnancy, unspecified, third trimester: Secondary | ICD-10-CM | POA: Insufficient documentation

## 2019-09-02 LAB — OB RESULTS CONSOLE GBS: GBS: POSITIVE

## 2019-09-02 MED ORDER — VALACYCLOVIR HCL 1 G PO TABS: 1000.0000 mg | ORAL_TABLET | Freq: Every day | ORAL | 3 refills | Status: DC

## 2019-09-02 NOTE — Progress Notes (Signed)
   PRENATAL VISIT NOTE  Subjective:  Sharon Morris is a 23 y.o. G1P0 at [redacted]w[redacted]d being seen today for ongoing prenatal care.  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  Patient reports no complaints.  Contractions: Not present.  .  Movement: Present. Denies leaking of fluid.   The following portions of the patient's history were reviewed and updated as appropriate: allergies, current medications, past family history, past medical history, past social history, past surgical history and problem list.   Objective:   Vitals:   09/02/19 1109  BP: 117/80  Pulse: 72  Weight: 179 lb 3.2 oz (81.3 kg)    Fetal Status: Fetal Heart Rate (bpm): 144   Movement: Present     General:  Alert, oriented and cooperative. Patient is in no acute distress.  Skin: Skin is warm and dry. No rash noted.   Cardiovascular: Normal heart rate noted  Respiratory: Normal respiratory effort, no problems with respiration noted  Abdomen: Soft, gravid, appropriate for gestational age.  Pain/Pressure: Present     Pelvic: Cervical exam performed        Extremities: Normal range of motion.  Edema: Trace  Mental Status: Normal mood and affect. Normal behavior. Normal judgment and thought content.   Assessment and Plan:  Pregnancy: G1P0 at [redacted]w[redacted]d 1. Encounter for supervision of normal pregnancy in third trimester, unspecified gravidity - routine care - Culture, beta strep (group b only) - GC/Chlamydia probe amp ([place])not at [facility] - Rx for patient to start daily suppression for HSV  Term labor symptoms and general obstetric precautions including but not limited to vaginal bleeding, contractions, leaking of fluid and fetal movement were reviewed in detail with the patient. Please refer to  After Visit Summary for other counseling recommendations.   Return in about 1 week (around 09/09/2019) for virtual visit .  No future appointments.  Marcille Buffy DNP, CNM  09/02/19  11:24 AM

## 2019-09-05 LAB — CULTURE, BETA STREP (GROUP B ONLY): Strep Gp B Culture: POSITIVE — AB

## 2019-09-09 LAB — GC/CHLAMYDIA PROBE AMP (~~LOC~~) NOT AT ARMC
Chlamydia: NEGATIVE
Comment: NEGATIVE
Comment: NORMAL
Neisseria Gonorrhea: NEGATIVE

## 2019-09-13 ENCOUNTER — Inpatient Hospital Stay (HOSPITAL_COMMUNITY): Payer: Medicaid Other | Admitting: Anesthesiology

## 2019-09-13 ENCOUNTER — Encounter (HOSPITAL_COMMUNITY): Payer: Self-pay | Admitting: Obstetrics & Gynecology

## 2019-09-13 ENCOUNTER — Inpatient Hospital Stay (HOSPITAL_COMMUNITY)
Admission: AD | Admit: 2019-09-13 | Discharge: 2019-09-15 | DRG: 806 | Disposition: A | Discharge status: Home / Self Care | Payer: Medicaid Other | Attending: Family Medicine | Admitting: Family Medicine

## 2019-09-13 ENCOUNTER — Other Ambulatory Visit: Payer: Self-pay

## 2019-09-13 DIAGNOSIS — O9902 Anemia complicating childbirth: Secondary | ICD-10-CM | POA: Diagnosis present

## 2019-09-13 DIAGNOSIS — O9832 Other infections with a predominantly sexual mode of transmission complicating childbirth: Secondary | ICD-10-CM | POA: Diagnosis present

## 2019-09-13 DIAGNOSIS — O99324 Drug use complicating childbirth: Secondary | ICD-10-CM | POA: Diagnosis present

## 2019-09-13 DIAGNOSIS — Z87891 Personal history of nicotine dependence: Secondary | ICD-10-CM | POA: Diagnosis not present

## 2019-09-13 DIAGNOSIS — J45909 Unspecified asthma, uncomplicated: Secondary | ICD-10-CM | POA: Diagnosis present

## 2019-09-13 DIAGNOSIS — A6000 Herpesviral infection of urogenital system, unspecified: Secondary | ICD-10-CM | POA: Diagnosis present

## 2019-09-13 DIAGNOSIS — O99824 Streptococcus B carrier state complicating childbirth: Secondary | ICD-10-CM | POA: Diagnosis present

## 2019-09-13 DIAGNOSIS — O9952 Diseases of the respiratory system complicating childbirth: Secondary | ICD-10-CM | POA: Diagnosis present

## 2019-09-13 DIAGNOSIS — D649 Anemia, unspecified: Secondary | ICD-10-CM | POA: Diagnosis present

## 2019-09-13 DIAGNOSIS — O26893 Other specified pregnancy related conditions, third trimester: Secondary | ICD-10-CM | POA: Diagnosis present

## 2019-09-13 DIAGNOSIS — Z3A38 38 weeks gestation of pregnancy: Secondary | ICD-10-CM | POA: Diagnosis not present

## 2019-09-13 DIAGNOSIS — F1290 Cannabis use, unspecified, uncomplicated: Secondary | ICD-10-CM | POA: Diagnosis present

## 2019-09-13 DIAGNOSIS — Z20828 Contact with and (suspected) exposure to other viral communicable diseases: Secondary | ICD-10-CM | POA: Diagnosis present

## 2019-09-13 HISTORY — DX: Anemia, unspecified: D64.9

## 2019-09-13 LAB — COMPREHENSIVE METABOLIC PANEL
ALT: 11 U/L (ref 0–44)
AST: 25 U/L (ref 15–41)
Albumin: 2.9 g/dL — ABNORMAL LOW (ref 3.5–5.0)
Alkaline Phosphatase: 227 U/L — ABNORMAL HIGH (ref 38–126)
Anion gap: 13 (ref 5–15)
BUN: 6 mg/dL (ref 6–20)
CO2: 21 mmol/L — ABNORMAL LOW (ref 22–32)
Calcium: 9.4 mg/dL (ref 8.9–10.3)
Chloride: 103 mmol/L (ref 98–111)
Creatinine, Ser: 0.65 mg/dL (ref 0.44–1.00)
GFR calc Af Amer: >60 mL/min (ref >60)
GFR calc non Af Amer: >60 mL/min (ref >60)
Glucose, Bld: 93 mg/dL (ref 70–99)
Potassium: 3.9 mmol/L (ref 3.5–5.1)
Sodium: 137 mmol/L (ref 135–145)
Total Bilirubin: 0.4 mg/dL (ref 0.3–1.2)
Total Protein: 7.1 g/dL (ref 6.5–8.1)

## 2019-09-13 LAB — CBC
HCT: 30.5 % — ABNORMAL LOW (ref 36.0–46.0)
Hemoglobin: 9.4 g/dL — ABNORMAL LOW (ref 12.0–15.0)
MCH: 26.8 pg (ref 26.0–34.0)
MCHC: 30.8 g/dL (ref 30.0–36.0)
MCV: 86.9 fL (ref 80.0–100.0)
Platelets: 328 10*3/uL (ref 150–400)
RBC: 3.51 MIL/uL — ABNORMAL LOW (ref 3.87–5.11)
RDW: 14.2 % (ref 11.5–15.5)
WBC: 10.5 10*3/uL (ref 4.0–10.5)
nRBC: 0 % (ref 0.0–0.2)

## 2019-09-13 LAB — TYPE AND SCREEN
ABO/RH(D): O POS
Antibody Screen: NEGATIVE

## 2019-09-13 LAB — RPR: RPR Ser Ql: NONREACTIVE

## 2019-09-13 LAB — ABO/RH: ABO/RH(D): O POS

## 2019-09-13 LAB — SARS CORONAVIRUS 2 (TAT 6-24 HRS): SARS Coronavirus 2: NEGATIVE

## 2019-09-13 MED ORDER — COCONUT OIL OIL: 1.0000 "application " | TOPICAL_OIL | Status: DC | PRN

## 2019-09-13 MED ORDER — LIDOCAINE HCL (PF) 1 % IJ SOLN
INTRAMUSCULAR | Status: AC
  Filled 2019-09-13: qty 30

## 2019-09-13 MED ORDER — LORATADINE 10 MG PO TABS
10.0000 mg | ORAL_TABLET | Freq: Every day | ORAL | Status: DC
  Filled 2019-09-13: qty 1

## 2019-09-13 MED ORDER — LACTATED RINGERS IV SOLN: INTRAVENOUS | Status: DC

## 2019-09-13 MED ORDER — PENICILLIN G POT IN DEXTROSE 60000 UNIT/ML IV SOLN
3.0000 10*6.[IU] | INTRAVENOUS | Status: DC
  Administered 2019-09-13: 3 10*6.[IU] via INTRAVENOUS
  Filled 2019-09-13 (×2): qty 50

## 2019-09-13 MED ORDER — FENTANYL-BUPIVACAINE-NACL 0.5-0.125-0.9 MG/250ML-% EP SOLN: 12.0000 mL/h | EPIDURAL | Status: DC | PRN

## 2019-09-13 MED ORDER — TETANUS-DIPHTH-ACELL PERTUSSIS 5-2.5-18.5 LF-MCG/0.5 IM SUSP: 0.5000 mL | Freq: Once | INTRAMUSCULAR | Status: DC

## 2019-09-13 MED ORDER — BENZOCAINE-MENTHOL 20-0.5 % EX AERO: 1.0000 "application " | INHALATION_SPRAY | CUTANEOUS | Status: DC | PRN

## 2019-09-13 MED ORDER — ONDANSETRON HCL 4 MG/2ML IJ SOLN
4.0000 mg | Freq: Four times a day (QID) | INTRAMUSCULAR | Status: DC | PRN
  Administered 2019-09-13: 4 mg via INTRAVENOUS
  Filled 2019-09-13: qty 2

## 2019-09-13 MED ORDER — SODIUM CHLORIDE (PF) 0.9 % IJ SOLN
INTRAMUSCULAR | Status: DC | PRN
  Administered 2019-09-13: 12 mL/h via EPIDURAL

## 2019-09-13 MED ORDER — IBUPROFEN 600 MG PO TABS
600.0000 mg | ORAL_TABLET | Freq: Four times a day (QID) | ORAL | Status: DC
  Administered 2019-09-13 – 2019-09-15 (×7): 600 mg via ORAL
  Filled 2019-09-13 (×8): qty 1

## 2019-09-13 MED ORDER — PRENATAL MULTIVITAMIN CH
1.0000 | ORAL_TABLET | Freq: Every day | ORAL | Status: DC
  Administered 2019-09-14: 1 via ORAL
  Filled 2019-09-13: qty 1

## 2019-09-13 MED ORDER — ACETAMINOPHEN 325 MG PO TABS
650.0000 mg | ORAL_TABLET | ORAL | Status: DC | PRN
  Administered 2019-09-13: 650 mg via ORAL
  Filled 2019-09-13: qty 2

## 2019-09-13 MED ORDER — FERROUS SULFATE 325 (65 FE) MG PO TABS
325.0000 mg | ORAL_TABLET | Freq: Two times a day (BID) | ORAL | Status: DC
  Administered 2019-09-13 – 2019-09-14 (×2): 325 mg via ORAL
  Filled 2019-09-13 (×3): qty 1

## 2019-09-13 MED ORDER — PHENYLEPHRINE 40 MCG/ML (10ML) SYRINGE FOR IV PUSH (FOR BLOOD PRESSURE SUPPORT): 80.0000 ug | PREFILLED_SYRINGE | INTRAVENOUS | Status: DC | PRN

## 2019-09-13 MED ORDER — OXYTOCIN 40 UNITS IN NORMAL SALINE INFUSION - SIMPLE MED
2.5000 [IU]/h | INTRAVENOUS | Status: DC
  Filled 2019-09-13: qty 1000

## 2019-09-13 MED ORDER — WITCH HAZEL-GLYCERIN EX PADS: 1.0000 "application " | MEDICATED_PAD | CUTANEOUS | Status: DC | PRN

## 2019-09-13 MED ORDER — SENNOSIDES-DOCUSATE SODIUM 8.6-50 MG PO TABS
2.0000 | ORAL_TABLET | ORAL | Status: DC
  Administered 2019-09-13 – 2019-09-15 (×2): 2 via ORAL
  Filled 2019-09-13 (×2): qty 2

## 2019-09-13 MED ORDER — SOD CITRATE-CITRIC ACID 500-334 MG/5ML PO SOLN: 30.0000 mL | ORAL | Status: DC | PRN

## 2019-09-13 MED ORDER — DIPHENHYDRAMINE HCL 50 MG/ML IJ SOLN: 12.5000 mg | INTRAMUSCULAR | Status: DC | PRN

## 2019-09-13 MED ORDER — FENTANYL-BUPIVACAINE-NACL 0.5-0.125-0.9 MG/250ML-% EP SOLN
EPIDURAL | Status: AC
  Filled 2019-09-13: qty 250

## 2019-09-13 MED ORDER — LACTATED RINGERS IV SOLN
500.0000 mL | Freq: Once | INTRAVENOUS | Status: AC
  Administered 2019-09-13: 500 mL via INTRAVENOUS

## 2019-09-13 MED ORDER — LIDOCAINE HCL (PF) 1 % IJ SOLN
30.0000 mL | INTRAMUSCULAR | Status: AC | PRN
  Administered 2019-09-13 (×2): 6 mL via SUBCUTANEOUS

## 2019-09-13 MED ORDER — DIBUCAINE (PERIANAL) 1 % EX OINT: 1.0000 "application " | TOPICAL_OINTMENT | CUTANEOUS | Status: DC | PRN

## 2019-09-13 MED ORDER — SODIUM CHLORIDE 0.9 % IV SOLN
5.0000 10*6.[IU] | Freq: Once | INTRAVENOUS | Status: AC
  Administered 2019-09-13: 5 10*6.[IU] via INTRAVENOUS
  Filled 2019-09-13: qty 5

## 2019-09-13 MED ORDER — FENTANYL CITRATE (PF) 100 MCG/2ML IJ SOLN: 50.0000 ug | INTRAMUSCULAR | Status: DC | PRN

## 2019-09-13 MED ORDER — OXYTOCIN BOLUS FROM INFUSION
500.0000 mL | Freq: Once | INTRAVENOUS | Status: AC
  Administered 2019-09-13: 500 mL via INTRAVENOUS

## 2019-09-13 MED ORDER — EPHEDRINE 5 MG/ML INJ: 10.0000 mg | INTRAVENOUS | Status: DC | PRN

## 2019-09-13 MED ORDER — SIMETHICONE 80 MG PO CHEW: 80.0000 mg | CHEWABLE_TABLET | ORAL | Status: DC | PRN

## 2019-09-13 MED ORDER — ZOLPIDEM TARTRATE 5 MG PO TABS: 5.0000 mg | ORAL_TABLET | Freq: Every evening | ORAL | Status: DC | PRN

## 2019-09-13 MED ORDER — ONDANSETRON HCL 4 MG PO TABS: 4.0000 mg | ORAL_TABLET | ORAL | Status: DC | PRN

## 2019-09-13 MED ORDER — ONDANSETRON HCL 4 MG/2ML IJ SOLN: 4.0000 mg | INTRAMUSCULAR | Status: DC | PRN

## 2019-09-13 MED ORDER — LACTATED RINGERS IV SOLN: 500.0000 mL | INTRAVENOUS | Status: DC | PRN

## 2019-09-13 MED ORDER — ACETAMINOPHEN 325 MG PO TABS: 650.0000 mg | ORAL_TABLET | ORAL | Status: DC | PRN

## 2019-09-13 MED ORDER — DIPHENHYDRAMINE HCL 25 MG PO CAPS: 25.0000 mg | ORAL_CAPSULE | Freq: Four times a day (QID) | ORAL | Status: DC | PRN

## 2019-09-13 MED ORDER — ALBUTEROL SULFATE (2.5 MG/3ML) 0.083% IN NEBU: 3.0000 mL | INHALATION_SOLUTION | Freq: Four times a day (QID) | RESPIRATORY_TRACT | Status: DC | PRN

## 2019-09-13 NOTE — Anesthesia Preprocedure Evaluation (Signed)
Anesthesia Evaluation  Patient identified by MRN, date of birth, ID band Patient awake    Reviewed: Allergy & Precautions, H&P , NPO status , Patient's Chart, lab work & pertinent test results  Airway Mallampati: II  TM Distance: >3 FB Neck ROM: full    Dental no notable dental hx. (+) Teeth Intact   Pulmonary asthma , former smoker,    Pulmonary exam normal breath sounds clear to auscultation       Cardiovascular negative cardio ROS Normal cardiovascular exam Rhythm:regular Rate:Normal     Neuro/Psych negative neurological ROS  negative psych ROS   GI/Hepatic negative GI ROS, Neg liver ROS,   Endo/Other  negative endocrine ROS  Renal/GU negative Renal ROS  negative genitourinary   Musculoskeletal   Abdominal (+) + obese,   Peds  Hematology  (+) Blood dyscrasia, anemia ,   Anesthesia Other Findings   Reproductive/Obstetrics (+) Pregnancy                             Anesthesia Physical Anesthesia Plan  ASA: II  Anesthesia Plan: Epidural   Post-op Pain Management:    Induction:   PONV Risk Score and Plan:   Airway Management Planned:   Additional Equipment:   Intra-op Plan:   Post-operative Plan:   Informed Consent: I have reviewed the patients History and Physical, chart, labs and discussed the procedure including the risks, benefits and alternatives for the proposed anesthesia with the patient or authorized representative who has indicated his/her understanding and acceptance.       Plan Discussed with:   Anesthesia Plan Comments:         Anesthesia Quick Evaluation  

## 2019-09-13 NOTE — H&P (Signed)
LABOR AND DELIVERY ADMISSION HISTORY AND PHYSICAL NOTE  Sharon Morris is a 23 y.o. female G1P0 with IUP at [redacted]w[redacted]d by LMP c/w 11wk [place] presenting for spontaneous labor.   She reports positive fetal movement. She denies leakage of fluid, vaginal bleeding, or contractions.   She plans on breast feeding. Her contraception plan is: undecided.  Prenatal History/Complications: PNC at [facility]:  @[redacted]w[redacted]d , CWD, normal anatomy, breech presentation, posterior placenta, 46%ile, EFW 269 g  Pregnancy complications:  - Hx HSV  Past Medical History: Past Medical History:  Diagnosis Date  . Asthma   . Eczema   . Herpes    last outbreak in August    Past Surgical History: Past Surgical History:  Procedure Laterality Date  . NO PAST SURGERIES      Obstetrical History: OB History    Gravida  1   Para      Term      Preterm      AB      Living        SAB      TAB      Ectopic      Multiple      Live Births              Social History: Social History   Socioeconomic History  . Marital status: Single    Spouse name: Not on file  . Number of children: Not on file  . Years of education: 12 grade  . Highest education level: High school graduate  Occupational History  . Not on file  Tobacco Use  . Smoking status: Former Smoker    Types: Cigars  . Smokeless tobacco: Never Used  Substance and Sexual Activity  . Alcohol use: Not Currently  . Drug use: Yes    Types: Marijuana    Comment: last smoked 09/12/2019  . Sexual activity: Yes    Birth control/protection: None  Other Topics Concern  . Not on file  Social History Narrative  . Not on file   Social Determinants of Health   Financial Resource Strain: Low Risk   . Difficulty of Paying Living Expenses: Not hard at all  Food Insecurity: No Food Insecurity  . Worried About Charity fundraiser in the Last Year: Never true  . Ran Out of Food in the Last Year: Never true  Transportation Needs: No  Transportation Needs  . Lack of Transportation (Medical): No  . Lack of Transportation (Non-Medical): No  Physical Activity: Insufficiently Active  . Days of Exercise per Week: 4 days  . Minutes of Exercise per Session: 30 min  Stress: Stress Concern Present  . Feeling of Stress : To some extent  Social Connections: Somewhat Isolated  . Frequency of Communication with Friends and Family: More than three times a week  . Frequency of Social Gatherings with Friends and Family: More than three times a week  . Attends Religious Services: More than 4 times per year  . Active Member of Clubs or Organizations: No  . Attends Archivist Meetings: Never  . Marital Status: Never married    Family History: Family History  Problem Relation Age of Onset  . Hypertension Other   . Diabetes Other   . Cancer Other   . Stroke Other   . Hypertension Mother   . Diabetes Mother   . Hypertension Father     Allergies: No Known Allergies  Medications Prior to Admission  Medication Sig Dispense Refill Last Dose  .  Prenatal Vit-Fe Phos-FA-Omega (VITAFOL GUMMIES) 3.33-0.333-34.8 MG CHEW Chew 3 each by mouth daily. 90 tablet 12 09/13/2019 at Unknown time  . albuterol (PROVENTIL HFA;VENTOLIN HFA) 108 (90 Base) MCG/ACT inhaler Inhale 1-2 puffs into the lungs every 6 (six) hours as needed for wheezing or shortness of breath. (Patient not taking: Reported on 09/02/2019) 1 Inhaler 0   . cetirizine (ZYRTEC) 10 MG tablet Take 10 mg by mouth daily as needed for allergies.      . ferrous sulfate (FERROUSUL) 325 (65 FE) MG tablet Take 1 tablet (325 mg total) by mouth 2 (two) times daily. (Patient not taking: Reported on 09/02/2019) 60 tablet 1   . ondansetron (ZOFRAN ODT) 8 MG disintegrating tablet Take 1 tablet (8 mg total) by mouth every 8 (eight) hours as needed for nausea or vomiting. Use when phenergan is not working (Patient not taking: Reported on 09/02/2019) 30 tablet 0   . promethazine (PHENERGAN)  25 MG tablet Take 1 tablet (25 mg total) by mouth every 6 (six) hours as needed for nausea or vomiting. (Patient not taking: Reported on 09/02/2019) 30 tablet 3   . terconazole (TERAZOL 7) 0.4 % vaginal cream Place 1 applicator vaginally at bedtime. (Patient not taking: Reported on 09/02/2019) 45 g 0   . triamcinolone (NASACORT) 55 MCG/ACT AERO nasal inhaler Place 2 sprays into the nose daily as needed (for rhinitis).      . triamcinolone ointment (KENALOG) 0.1 % Apply 1 application topically daily.  0   . valACYclovir (VALTREX) 1000 MG tablet Take 1 tablet (1,000 mg total) by mouth daily. 30 tablet 3      Review of Systems  All systems reviewed and negative except as stated in HPI  Physical Exam Blood pressure 136/86, pulse 75, height 5\' 3"  (1.6 m), weight 82.2 kg, last menstrual period 12/21/2018. General appearance: alert, oriented, NAD Lungs: normal respiratory effort Heart: regular rate Abdomen: soft, non-tender; gravid Extremities: No calf swelling or tenderness Presentation: cephalic by RN SVE  Fetal monitoringBaseline: 125 bpm, Variability: Good {> 6 bpm), Accelerations: Reactive and Decelerations: Absent Uterine activityFrequency: Every 3 minutes  Dilation: 5 Effacement (%): 90 Station: -2 Exam by:: [email or web address], RN  Prenatal labs: ABO, Rh: O/Positive/-- (06/24 1539) Antibody: Negative (06/24 1539) Rubella: 4.09 (06/24 1539) RPR: Non Reactive (10/09 0832)  HBsAg: Negative (06/24 1539)  HIV: Non Reactive (10/09 0832)  GC/Chlamydia: neg/neg 09/02/2019  GBS: Positive/-- (12/11 1135)  2-hr GTT: normal 07/01/2019 08/31/2019) Genetic screening:  Low risk panorama Anatomy (74,081,44: normal, isolated EIF  Prenatal Transfer Tool  Maternal Diabetes: No Genetic Screening: Normal Maternal Ultrasounds/Referrals: Normal and Isolated EIF (echogenic intracardiac focus) Fetal Ultrasounds or other Referrals:  None Maternal Substance Abuse:  No Significant Maternal Medications:   None Significant Maternal Lab Results: Group B Strep positive  No results found for this or any previous visit (from the past 24 hour(s)).  Patient Active Problem List   Diagnosis Date Noted  . Normal labor 09/13/2019  . Anemia affecting pregnancy, antepartum 07/11/2019  . 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 Supervision of normal first pregnancy, antepartum 03/01/2019    Assessment: Sharon Morris is a 23 y.o. G1P0 at [redacted]w[redacted]d here for spontaneous labor.   #Labor: Allow period of expectant management, consider AROM once receives first dose of penicillin and comfortable w epidural #Pain: Plans for epidural once labs returned #FWB: Cat I #GBS/ID: Positive, penicillin #COVID: swab pending #MOF: Breast #MOC: Undecided #Circ: n/a  #Hx HSV: Rx Valtrex,  has not been taking it since outbreak several months ago. Denies any symptoms currently, no lesions seen on exam.  Mary SellaMatthew M Norman Specialty HospitalEckstat 09/13/2019, 4:53 AM

## 2019-09-13 NOTE — Anesthesia Procedure Notes (Signed)
Epidural Patient location during procedure: OB Start time: 09/13/2019 6:10 AM End time: 09/13/2019 6:14 AM  Staffing Anesthesiologist: Lyn Hollingshead, MD Performed: anesthesiologist   Preanesthetic Checklist Completed: patient identified, IV checked, site marked, risks and benefits discussed, surgical consent, monitors and equipment checked, pre-op evaluation and timeout performed  Epidural Patient position: sitting Prep: DuraPrep and site prepped and draped Patient monitoring: continuous pulse ox and blood pressure Approach: midline Location: L3-L4 Injection technique: LOR air  Needle:  Needle type: Tuohy  Needle gauge: 17 G Needle length: 9 cm and 9 Needle insertion depth: 7 cm Catheter type: closed end flexible Catheter size: 19 Gauge Catheter at skin depth: 12 cm Test dose: negative and Other  Assessment Events: blood not aspirated, injection not painful, no injection resistance, no paresthesia and negative IV test  Additional Notes Reason for block:procedure for pain

## 2019-09-13 NOTE — Progress Notes (Signed)
Sharon Morris is a 23 y.o. G1P0 at [redacted]w[redacted]d by LMP admitted for active labor  Subjective: She is comfortable now with an epidural. Seated in high-Fowler's position. RN reports that she just checked the patient after insertion of epidural and found her to be dilated to 9.5 cm with a BBOW.   Objective: BP 125/67   Pulse 71   Temp 99.2 F (37.3 C) (Oral)   Resp 18   Ht 5\' 3"  (1.6 m)   Wt 82.1 kg   LMP 12/21/2018 (Exact Date)   SpO2 100%   BMI 32.06 kg/m  No intake/output data recorded. No intake/output data recorded.  FHT:  FHR: 125 bpm, variability: moderate,  accelerations:  Present,  decelerations:  Absent UC:   regular, every 2-6 minutes SVE:   Dilation: 9 Effacement (%): 90 Station: 0 Exam by:: Cecelia Byars, RN AROM forebag without difficulty - copious amounts of clear fluid in return. Patient tolerated procedure well. Cervical exam after AROM: 9.5 cm/100/0/vtx by R. Renato Battles, CNM  Labs: Lab Results  Component Value Date   WBC 10.5 09/13/2019   HGB 9.4 (L) 09/13/2019   HCT 30.5 (L) 09/13/2019   MCV 86.9 09/13/2019   PLT 328 09/13/2019    Assessment / Plan: Spontaneous labor, progressing normally  Labor: Progressing normally Preeclampsia:  n/a Fetal Wellbeing:  Category I Pain Control:  Epidural I/D:  n/a Anticipated MOD:  NSVD  Laury Deep, MSN, CNM 09/13/2019, 10:51 AM

## 2019-09-13 NOTE — Discharge Summary (Signed)
Postpartum Discharge Summary  Date of Service updated      Patient Name: Sharon Morris DOB: 18-Nov-1995 MRN: [id number]  Date of admission: 09/13/2019 Delivering Provider: Laury Deep   Date of discharge: 09/15/2019  Admitting diagnosis: Normal labor [O80, Z37.9] Intrauterine pregnancy: [redacted]w[redacted]d    Secondary diagnosis:  Active Problems:   Normal labor  Additional problems: anemia     Discharge diagnosis: Term Pregnancy Delivered and Anemia                                                                                                Post partum procedures:NA  Augmentation: AROM and Pitocin  Complications: None  Hospital course:  Onset of Labor With Vaginal Delivery     23y.o. yo G1P1001 at 381w0das admitted in Latent Labor on 09/13/2019. Patient had an uncomplicated labor course as follows:  Membrane Rupture Time/Date: 10:00 AM ,09/13/2019   Intrapartum Procedures: Episiotomy: None [1]                                         Lacerations:  None [1]  Patient had a delivery of a Viable infant. 09/13/2019  Information for the patient's newborn:  WrRindy, Kollman0[[id number]]     Pateint had an uncomplicated postpartum course.  She is ambulating, tolerating a regular diet, passing flatus, and urinating well. Patient is discharged home in stable condition on 09/15/19. Patient's BP has been normal over the course of her hospital stay. She is not in need of any anti-hypertensives at this time.   Delivery time: 1:21 PM    Magnesium Sulfate received: No BMZ received: No Rhophylac:No MMR:No Transfusion:No  Physical exam  Vitals:   09/14/19 0119 09/14/19 0529 09/14/19 2145 09/15/19 0527  BP: 113/60 108/71 116/65 114/66  Pulse: 72 63 (!) 58 69  Resp: 18 18 18 17   Temp: 98 F (36.7 C) 97.8 F (36.6 C) 98.6 F (37 C) 98 F (36.7 C)  TempSrc: Oral Oral Oral Oral  SpO2: 100% 100%  99%  Weight:      Height:       General: alert, cooperative and no distress.  She denies symptoms of HA, blurry vision, RUQ, sudden swelling or floating spots.  Lochia: appropriate Uterine Fundus: firm Incision: N/A DVT Evaluation: No evidence of DVT seen on physical exam. Labs: Lab Results  Component Value Date   WBC 10.5 09/13/2019   HGB 9.4 (L) 09/13/2019   HCT 30.5 (L) 09/13/2019   MCV 86.9 09/13/2019   PLT 328 09/13/2019   CMP Latest Ref Rng & Units 09/13/2019  Glucose 70 - 99 mg/dL 93  BUN 6 - 20 mg/dL 6  Creatinine 0.44 - 1.00 mg/dL 0.65  Sodium 135 - 145 mmol/L 137  Potassium 3.5 - 5.1 mmol/L 3.9  Chloride 98 - 111 mmol/L 103  CO2 22 - 32 mmol/L 21(L)  Calcium 8.9 - 10.3 mg/dL 9.4  Total Protein 6.5 - 8.1 g/dL 7.1  Total Bilirubin 0.3 -  1.2 mg/dL 0.4  Alkaline Phos 38 - 126 U/L 227(H)  AST 15 - 41 U/L 25  ALT 0 - 44 U/L 11    Discharge instruction: per After Visit Summary and "Baby and Me Booklet".  After visit meds:  Allergies as of 09/15/2019   No Known Allergies     Medication List    STOP taking these medications   ondansetron 8 MG disintegrating tablet Commonly known as: Zofran ODT   promethazine 25 MG tablet Commonly known as: PHENERGAN   terconazole 0.4 % vaginal cream Commonly known as: Terazol 7     TAKE these medications   albuterol 108 (90 Base) MCG/ACT inhaler Commonly known as: VENTOLIN HFA Inhale 1-2 puffs into the lungs every 6 (six) hours as needed for wheezing or shortness of breath.   calcium carbonate 500 MG chewable tablet Commonly known as: TUMS - dosed in mg elemental calcium Chew 2-3 tablets by mouth as needed for indigestion or heartburn.   cetirizine 10 MG tablet Commonly known as: ZYRTEC Take 10 mg by mouth daily as needed for allergies.   ferrous sulfate 325 (65 FE) MG tablet Commonly known as: FerrouSul Take 1 tablet (325 mg total) by mouth 2 (two) times daily.   triamcinolone ointment 0.1 % Commonly known as: KENALOG Apply 1 application topically daily.   valACYclovir 1000 MG  tablet Commonly known as: Valtrex Take 1 tablet (1,000 mg total) by mouth daily.   Vitafol Gummies 3.33-0.333-34.8 MG Chew Chew 3 each by mouth daily.       Diet: routine diet  Activity: Advance as tolerated. Pelvic rest for 6 weeks.   Outpatient follow up:6 weeks Follow up Appt: Future Appointments  Date Time Provider Wallace  10/27/2019  9:10 AM Laury Deep, CNM [facility] None   Follow up Visit: Follow-up Information    [facility]. Go on 10/27/2019.   Specialty: Obstetrics and Gynecology Why: My Chart virtual postpartum visit with Laury Deep, CNM Contact information: [place] [phone]           Please schedule this patient for Postpartum visit in: 6 weeks with the following provider: Any provider For C/S patients schedule nurse incision check in weeks 2 weeks: no Low risk pregnancy complicated by: (+) HSV Delivery mode:  SVD Anticipated Birth Control:  other/unsure PP Procedures needed: none  Schedule Integrated BH visit: no     Newborn Data: Live born female "L'Rehya" Birth Weight: 6 lb 10.5 oz (3020 g) APGAR: 9, 9  Newborn Delivery   Birth date/time: 09/13/2019 13:21:00 Delivery type: Vaginal, Spontaneous      Baby Feeding: Bottle and Breast Disposition:home with mother   09/15/2019 Starr [place], CNM

## 2019-09-13 NOTE — MAU Note (Signed)
Patient presents to MAU c/o irregular ctx. Patient reports ctx every 10-12 mins. +FM, denies LOF, reports spotting.

## 2019-09-13 NOTE — Lactation Note (Signed)
This note was copied from a baby's chart. Lactation Consultation Note  Patient Name: Sharon Morris [id number] Date: 09/13/2019 Reason for consult: Initial assessment;Primapara;1st time breastfeeding;Early term 51-38.6wks  7 hours old ETI female who is being exclusively BF by her mother, she's a P1. Mom reported (+) breast changes during the pregnancy. She wasn't familiar with hand expression, LC showed mom how to hand express, colostrum was easily obtained, however mom had a harder time to do it because her nails (acrylic) are very long about 1.5 inches. Mom said she had them done for her baby shower but that she'll be taking them off. She has a DEBP at home.  Baby was swaddled on mom's lap when entering the room, she told LC that she was trying to feed baby but she wouldn't wake up. LC offered assistance with latch and mom agree to wake baby up to feed. LC took her to the bassinet and she slightly woke up by the time she was picked up to be taken to mom's breast baby was crying and awake. Noticed that baby's tongue was short when doing suck training, she was also biting, mom mentioned that maternal GOB had a lip tie.  LC took baby STS to mother's left breast in cross cradle position and she was able to latch shortly after, but mom kept breaking the latch, pulling the areolar tissue out of baby's mouth because she was afraid that baby couldn't breathe. Explained to mom how newborn's anatomy work and kept helping her reposition baby but after the 4th time, baby just fell asleep. A couple of audible swallows noted during the 4 minutes feeding.  Feeding plan  1. Encouraged mom to feed baby STS 8-12 times/24 hours or sooner if feeding cues are present 2. Hand expression and spoon feeding were also encouraged  BF brochure. BF resources and feeding diary were reviewed. Mom didn't have a support person in the room at this time. She reported all questions and concerns were answered, she's aware of [facility] OP  services and will call PRN.  Maternal Data Formula Feeding for Exclusion: No Has patient been taught Hand Expression?: Yes Does the patient have breastfeeding experience prior to this delivery?: No  Feeding Feeding Type: Breast Fed  LATCH Score Latch: Grasps breast easily, tongue down, lips flanged, rhythmical sucking.  Audible Swallowing: A few with stimulation(with compressions)  Type of Nipple: Everted at rest and after stimulation  Comfort (Breast/Nipple): Soft / non-tender  Hold (Positioning): Assistance needed to correctly position infant at breast and maintain latch.  LATCH Score: 8  Interventions Interventions: Breast feeding basics reviewed;Assisted with latch;Skin to skin;Breast massage;Hand express;Breast compression;Support pillows;Adjust position  Lactation Tools Discussed/Used WIC Program: No   Consult Status Consult Status: Follow-up Date: 09/14/19 Follow-up type: In-patient    Sharon Morris 09/13/2019, 8:30 PM

## 2019-09-14 ENCOUNTER — Encounter: Payer: Medicaid Other | Admitting: Student

## 2019-09-14 ENCOUNTER — Encounter

## 2019-09-14 ENCOUNTER — Telehealth: Payer: Medicaid Other | Admitting: Student

## 2019-09-14 NOTE — Anesthesia Postprocedure Evaluation (Signed)
Anesthesia Post Note  Patient: Sharon Morris  Procedure(s) Performed: AN AD [place]     Patient location during evaluation: Mother Baby Anesthesia Type: Epidural Level of consciousness: awake, awake and alert and oriented Pain management: pain level controlled Vital Signs Assessment: post-procedure vital signs reviewed and stable Respiratory status: spontaneous breathing and respiratory function stable Cardiovascular status: blood pressure returned to baseline Postop Assessment: no headache, epidural receding, patient able to bend at knees, adequate PO intake, no backache, no apparent nausea or vomiting and able to ambulate Anesthetic complications: no    Last Vitals:  Vitals:   09/14/19 0119 09/14/19 0529  BP: 113/60 108/71  Pulse: 72 63  Resp: 18 18  Temp: 36.7 C 36.6 C  SpO2: 100% 100%    Last Pain:  Vitals:   09/14/19 0740  TempSrc:   PainSc: 0-No pain   Pain Goal:                Epidural/Spinal Function Cutaneous sensation: Normal sensation (09/14/19 0740)  Bufford Spikes

## 2019-09-14 NOTE — Progress Notes (Addendum)
Post Partum Day 1  Subjective:  Sharon Morris is a 23 y.o. G1P1001 77w0ds/p SVD.  No acute events overnight.  Pt denies problems with ambulating, voiding or po intake.  She denies nausea or vomiting.  Pain is well controlled.  She has had flatus. She has not had bowel movement.  Lochia Small.  Plan for birth control is abstinence.  Method of Feeding: breast  Objective: BP 108/71 (BP Location: Right Arm)   Pulse 63   Temp 97.8 F (36.6 C) (Oral)   Resp 18   Ht _0  (1.6 m)   Wt 82.1 kg   LMP 12/21/2018 (Exact Date)   SpO2 100%   Breastfeeding Unknown   BMI 32.06 kg/m   Physical Exam:  General: alert, cooperative and no distress Lochia:normal flow Chest: normal work of breathing Heart: regular rate Abdomen: soft, nontender Uterine Fundus: firm DVT Evaluation: No evidence of DVT seen on physical exam.   Recent Labs    09/13/19 0457  HGB 9.4*  HCT 30.5*    Assessment/Plan:  ASSESSMENT: Sharon Durkinis a 23y.o. G1P1001 371w0dpd #1 s/p NSVD doing well.  She does seem to have good support in her family, but FOB is incarcerated. Will consult SW to further assess.  Plan for discharge tomorrow, Breastfeeding, Lactation consult, Social Work consult and Contraception declined Continue routine PP care   LOS: 1 day   KeDemetrius RevelDO 09/14/2019, 8:00 AM   I personally saw and evaluated the patient, performing the key elements of the service. I developed and verified the management plan that is described in the resident's/student's note, and I agree with the content with my edits above. VSS, HRR&R, Resp unlabored, Legs neg.  FrNigel BertholdCNM 09/19/2019 8:49 AM

## 2019-09-14 NOTE — Clinical Social Work Maternal (Signed)
CLINICAL SOCIAL WORK MATERNAL/CHILD NOTE  Patient Details  Name: Sharon Morris MRN: [id number] Date of Birth: 05/27/1996  Date:  09/14/2019  Clinical Social Worker Initiating Note:  Zivah Mayr Date/Time: Initiated:  09/14/19/1100     Child's Name:  La'Reyah Baldwin   Biological Parents:  Mother, Father(Kamea Kisling and LaReginald Baldwin DOB: 09/29/1996)   Need for Interpreter:  None   Reason for Referral:  Current Substance Use/Substance Use During Pregnancy (FOB incarcerated)   Address:  [address] [place] [address]    Phone number:  [phone] (home)     Additional phone number:   Household Members/Support Persons (HM/SP):   Household Member/Support Person 1   HM/SP Name Relationship DOB or Age  HM/SP -1 Dwynetta Barrantes Mother    HM/SP -2        HM/SP -3        HM/SP -4        HM/SP -5        HM/SP -6        HM/SP -7        HM/SP -8          Natural Supports (not living in the home):  Parent   Professional Supports: None   Employment: Unemployed   Type of Work:     Education:  High school graduate   Homebound arranged:    Financial Resources:  Medicaid   Other Resources:  (Intends to apply for WIC)   Cultural/Religious Considerations Which May Impact Care:    Strengths:  Ability to meet basic needs , Home prepared for child    Psychotropic Medications:         Pediatrician:       Pediatrician List:   [place]    [place]    [place]    [place]    [place]    [place]      Pediatrician Fax Number:    Risk Factors/Current Problems:  Substance Use    Cognitive State:  Able to Concentrate , Alert , Linear Thinking    Mood/Affect:  Calm , Comfortable , Interested , Relaxed    CSW Assessment:  CSW received consult for THC use during pregnancy and FOB currently being incarcerated.  CSW met with MOB to offer support and complete assessment.    MOB resting in bed  tending to infant with MGM at bedside, when CSW entered the room. CSW introduced self and requested that MGM step out of the room so that CSW could meet with MOB alone. MOB reported that she was ok with MGM staying during assessment and gave CSW permission to discuss anything in front of MGM. CSW explained reason for consult to which MOB expressed understanding. MOB and MGM both pleasant and engaged throughout assessment and MOB was observed to be appropriate and attentive to infant during visit. MOB reported she currently lives with MGM in [place]. CSW inquired about FOB's level of involvement and MOB shared that FOB is currently incarcerated as of 12/05. MOB stated they are unsure, at this time, for how long FOB will be there. CSW inquired about how MOB was adjusting and MOB stated she was ok. CSW inquired about MOB's mental health history and MOB denied having any. MOB stated she is aware of PPD/A but was receptive to brief review. CSW provided education regarding the baby blues period vs. perinatal mood disorders. CSW recommended self-evaluation during the postpartum time period using the New Mom Checklist from Postpartum Progress   and encouraged MOB to contact a medical professional if symptoms are noted at any time. MOB did not appear to be displaying any acute mental health symptoms and denied any current SI, HI or DV. MOB reported primary support as her mother.  CSW inquired about MOB's substance use during pregnancy and MOB acknowledged using marijuana to help with her appetite. Per MOB, her last use was on Monday. CSW informed MOB of Hospital Drug Policy and explained UDS came back positive for THC and CDS was still pending. CSW explained that due to infant's positive UDS that CSW would have to make [place] CPS report and explained what process would likely look like. MOB denied any questions or concerns regarding policy or report.   MOB confirmed having all essential items for infant  once discharged and reported infant would be sleeping in a crib once home. CSW provided review of Sudden Infant Death Syndrome (SIDS) precautions and safe sleeping habits.   CSW made [place] CPS report due to infant's positive UDS for THC.  No barriers to discharge, at this time, CPS to follow up within 72 hours.   CSW Plan/Description:  No Further Intervention Required/No Barriers to Discharge, Sudden Infant Death Syndrome (SIDS) Education, Perinatal Mood and Anxiety Disorder (PMADs) Education, Hospital Drug Screen Policy Information, Child Protective Service Report , CSW Will Continue to Monitor Umbilical Cord Tissue Drug Screen Results and Make Report if Warranted    Tc Kapusta, LCSW 09/14/2019, 11:28 AM  

## 2019-09-15 NOTE — Lactation Note (Signed)
This note was copied from a baby's chart. Lactation Consultation Note  Patient Name: Sharon Morris UMPNT'I Date: 09/15/2019 Reason for consult: Follow-up assessment [name] now 69 hours old with 5 percent weight loss.  A latch score has not been determined.  Mom reports that infant is latching and breastfeeding well.  Infant asleep next to moms breast content. Mom reprots she has God Children and lots of nieces and nephews and feels comfortable going home.  Being d/c today.  Reviewed how to know if baby getting enough. Mom has [facility] [employer].  Urged to follow up with lactation as needed.  Maternal Data    Feeding Feeding Type: Breast Fed  LATCH Score                   Interventions Interventions: Breast feeding basics reviewed  Lactation Tools Discussed/Used     Consult Status Consult Status: Complete Date: 09/15/19 Follow-up type: Call as needed    [facility] 09/15/2019, 8:56 AM

## 2019-09-15 NOTE — Lactation Note (Signed)
This note was copied from a baby's chart. Lactation Consultation Note Baby 69 hrs old. Mom holding baby on her chest. Baby sleeping soundly. Mom states baby hadn't been long BF. Encouraged mom to call RN for next feeding so RN can see latch. Need latch score before d/c home. Mom has everted nipples. Wearing bra. Discussed how to make hands free bra. Mom states has pump at home. Mom states she feels confident about going home in am. Discussed engorgement, pumping, milk storage, breast massage, supply and demand, breast massage and supplementing. Mom demonstrated hand expression of colostrum easily expressed. Mom's bra appears to be to small, encouraged mom get a larger bra after d/c home and milk is in. Mom states she doesn't have any questions or concerns. MGM will be assisting in care once home.  Patient Name: Sharon Morris NIOEV'O Date: 09/15/2019 Reason for consult: Follow-up assessment;Primapara;Early term 37-38.6wks   Maternal Data    Feeding    LATCH Score       Type of Nipple: Everted at rest and after stimulation  Comfort (Breast/Nipple): Soft / non-tender        Interventions Interventions: Breast feeding basics reviewed;Hand express;Breast massage;Breast compression;Support pillows  Lactation Tools Discussed/Used     Consult Status Consult Status: Complete Date: 09/15/19    Theodoro Kalata 09/15/2019, 12:27 AM

## 2019-09-21 ENCOUNTER — Encounter: Payer: Medicaid Other | Admitting: Advanced Practice Midwife

## 2019-10-27 ENCOUNTER — Telehealth: Payer: Medicaid Other | Admitting: Obstetrics and Gynecology

## 2019-12-28 IMAGING — US OBSTETRIC <14 WK US AND TRANSVAGINAL OB US
1 series · 15 of 28 positions shown · non-contrast
Comparison: None.

CLINICAL DATA: Pregnant patient.  Confirm dating and viability.

EXAM:
OBSTETRIC <14 WK US AND TRANSVAGINAL OB US
TECHNIQUE: Both transabdominal and transvaginal ultrasound examinations were
performed for complete evaluation of the gestation as well as the
maternal uterus, adnexal regions, and pelvic cul-de-sac.
Transvaginal technique was performed to assess early pregnancy.

[Series 1: obstetric <14 wk us and transvaginal ob us · 15 of 44 slices shown]
[im 1/44]
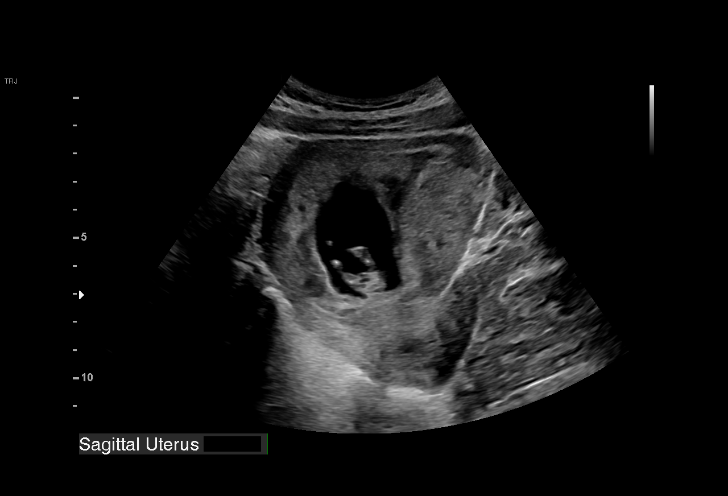
[im 4/44]
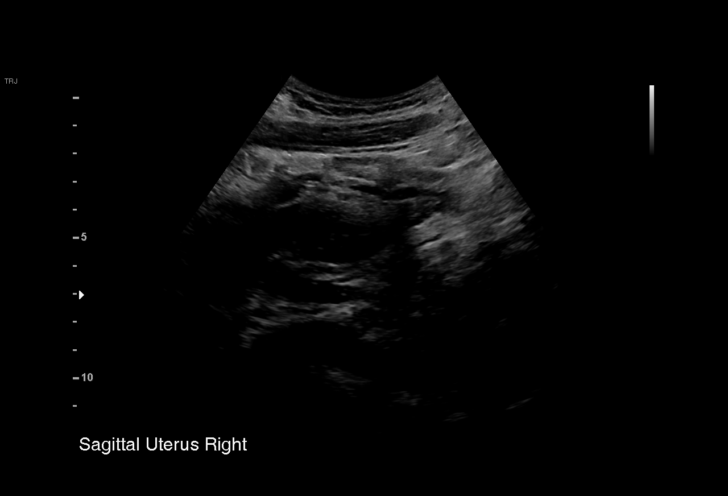
[im 7/44]
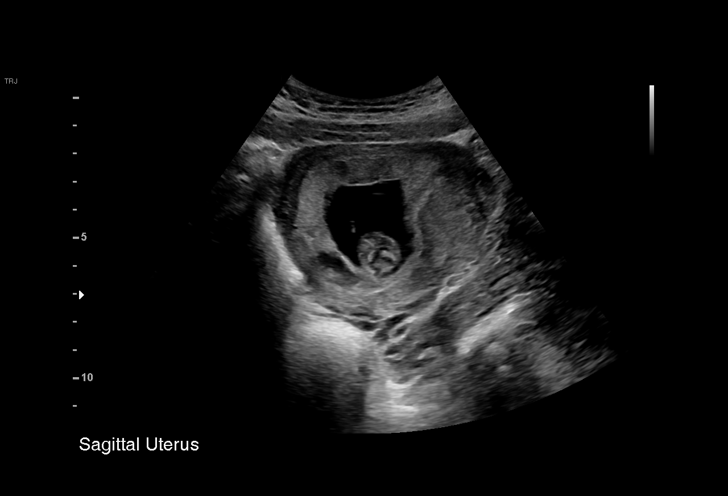
[im 10/44]
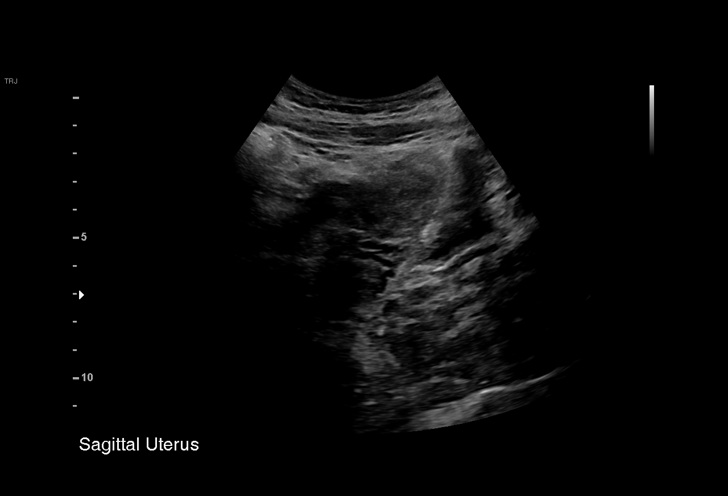
[im 13/44]
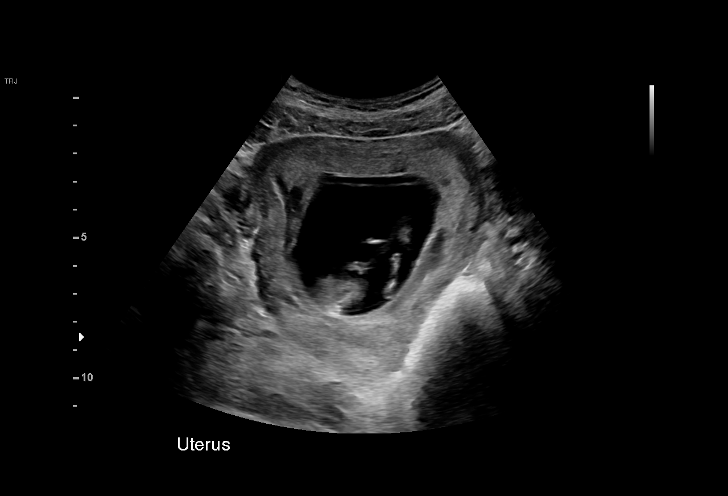
[im 16/44]
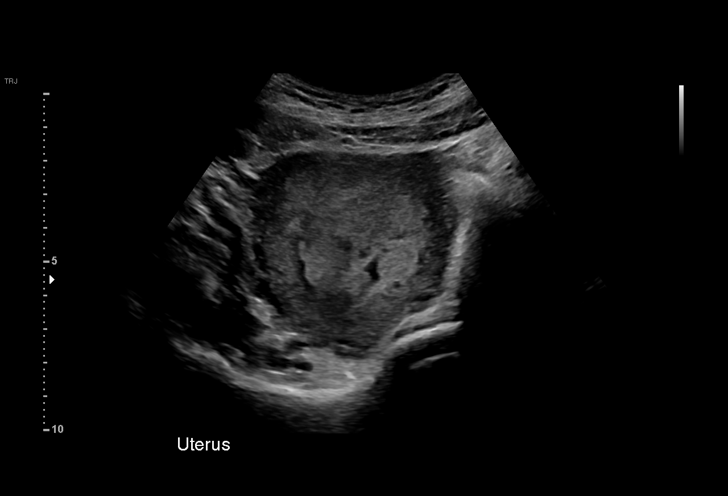
[im 20/44]
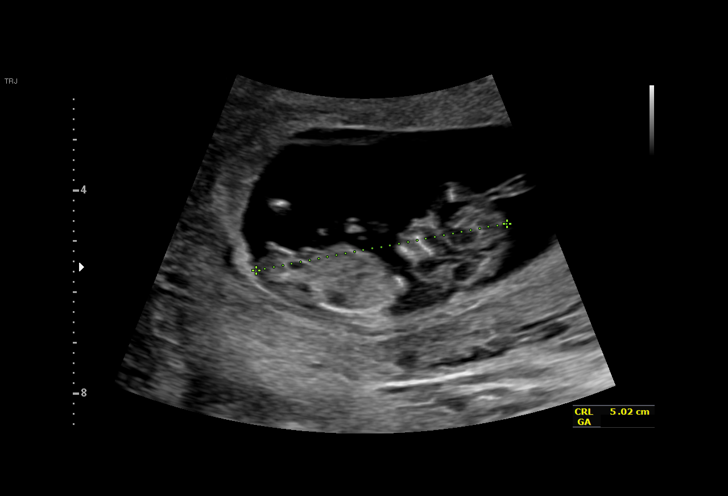
[im 23/44]
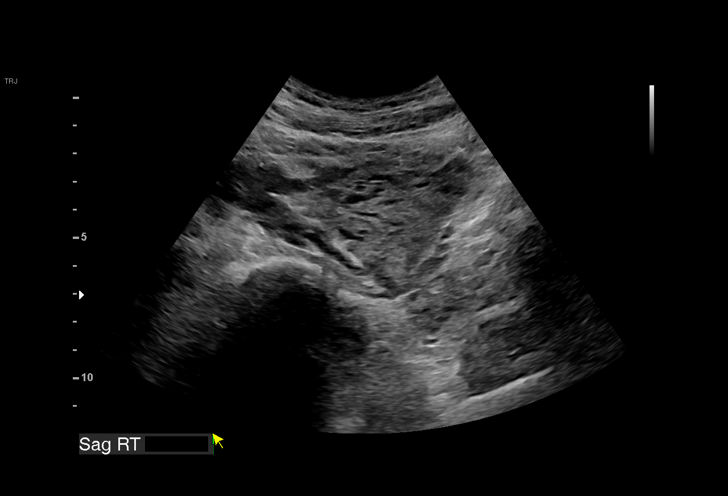
[im 24/44]
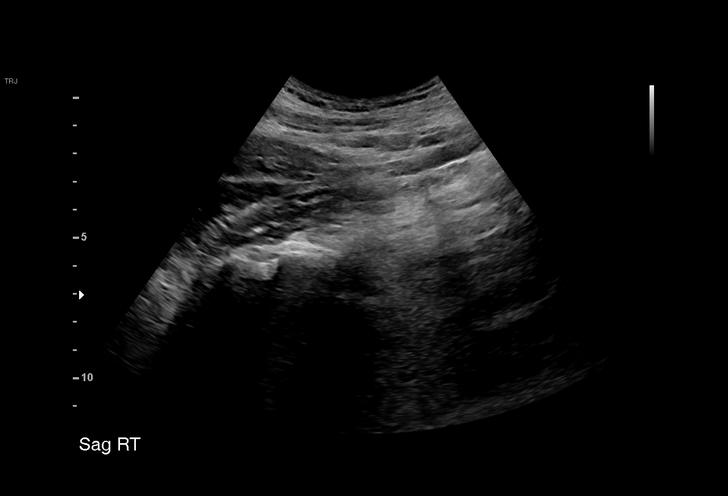
[im 28/44]
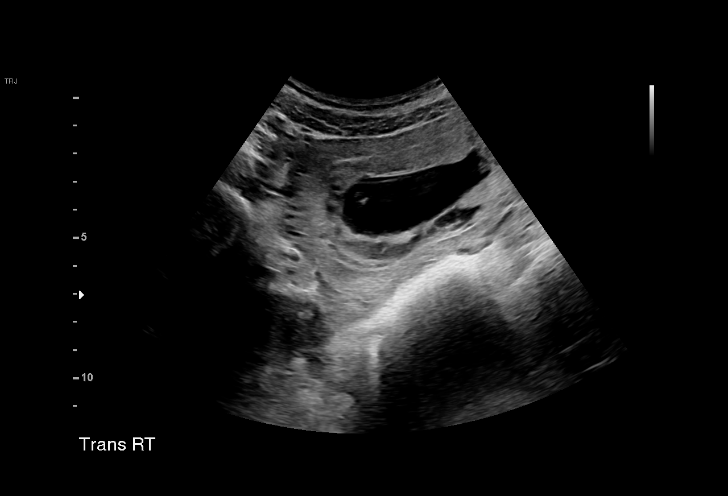
[im 31/44]
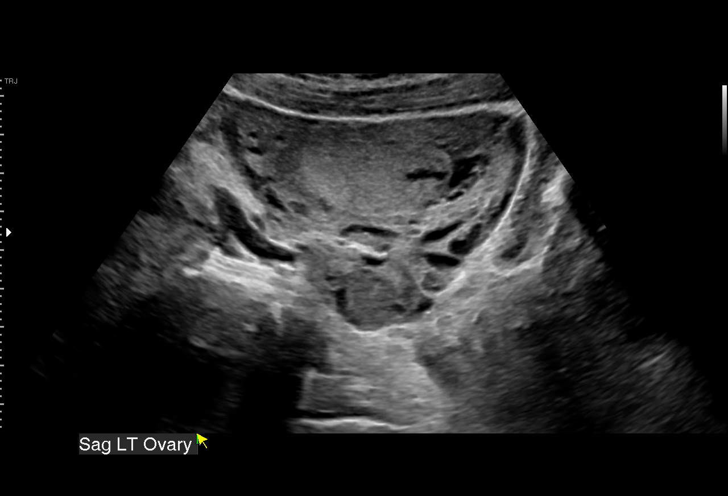
[im 34/44]
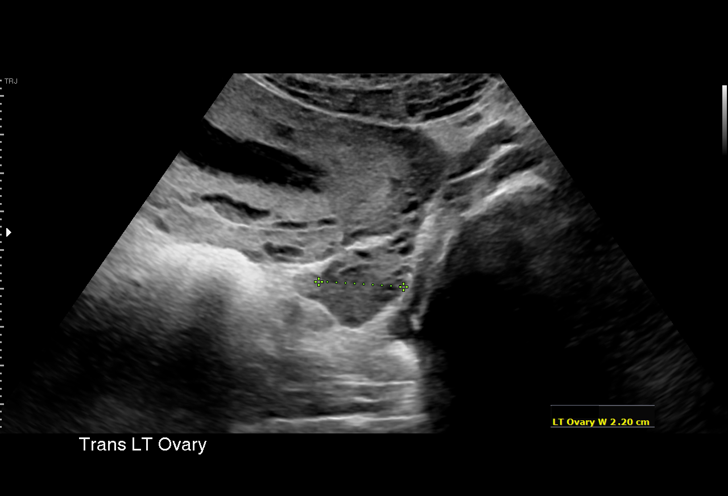
[im 37/44]
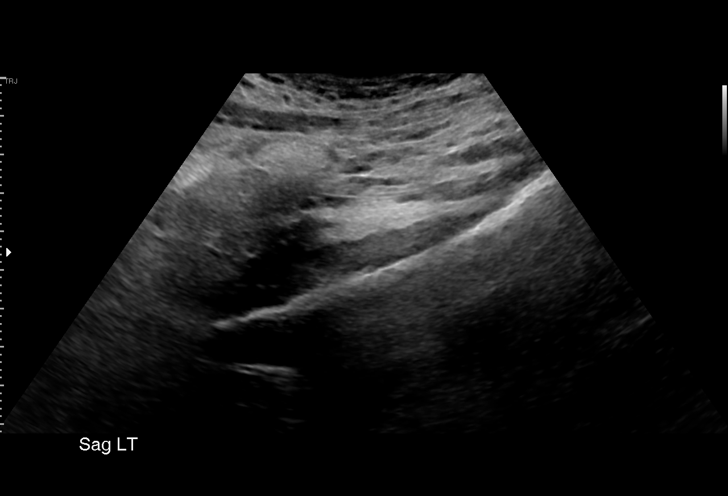
[im 40/44]
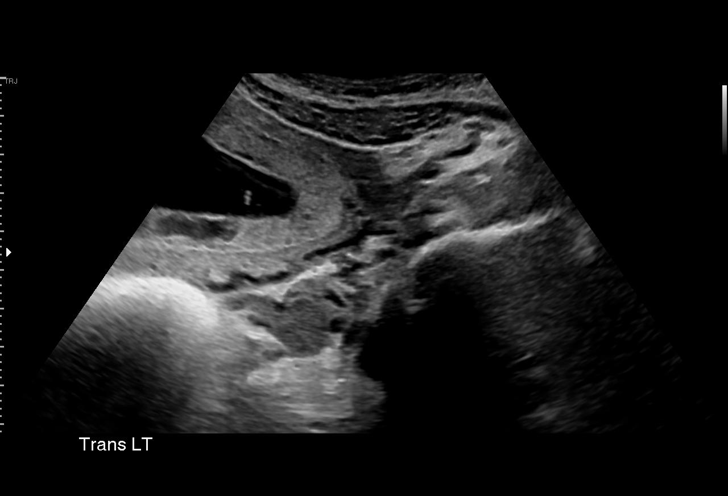
[im 44/44]
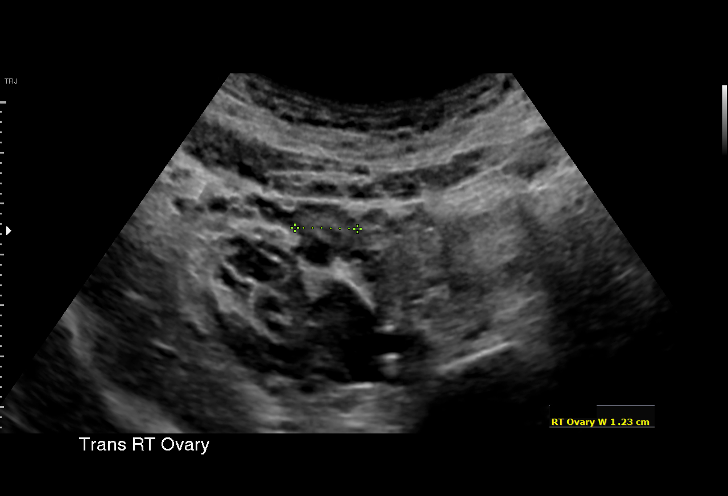

[15 of 28 positions shown; findings below may reference images not displayed]

FINDINGS: Intrauterine gestational sac: Single

Yolk sac:  Visualized.

Embryo:  Visualized.

Cardiac Activity: Visualized.

Heart Rate: 157 bpm

CRL:  51.8 mm   11 w   6 d                  US EDC: 09/23/2019

Subchorionic hemorrhage:  None visualized.

Maternal uterus/adnexae: Normal right and left ovaries. No free
fluid in the pelvis.
IMPRESSION: Single live intrauterine gestation.  No subchorionic hemorrhage.

## 2021-04-10 ENCOUNTER — Other Ambulatory Visit: Payer: Self-pay

## 2021-04-10 ENCOUNTER — Inpatient Hospital Stay (HOSPITAL_COMMUNITY): Payer: Medicaid Other

## 2021-04-10 ENCOUNTER — Encounter: Payer: Self-pay | Admitting: Student

## 2021-04-10 ENCOUNTER — Inpatient Hospital Stay (HOSPITAL_COMMUNITY)
Admission: AD | Admit: 2021-04-10 | Discharge: 2021-04-10 | Disposition: A | Discharge status: Home / Self Care | Payer: Medicaid Other | Attending: Obstetrics and Gynecology | Admitting: Obstetrics and Gynecology

## 2021-04-10 DIAGNOSIS — O26891 Other specified pregnancy related conditions, first trimester: Secondary | ICD-10-CM | POA: Insufficient documentation

## 2021-04-10 DIAGNOSIS — Z79899 Other long term (current) drug therapy: Secondary | ICD-10-CM | POA: Insufficient documentation

## 2021-04-10 DIAGNOSIS — O99611 Diseases of the digestive system complicating pregnancy, first trimester: Secondary | ICD-10-CM | POA: Diagnosis not present

## 2021-04-10 DIAGNOSIS — R109 Unspecified abdominal pain: Secondary | ICD-10-CM | POA: Diagnosis not present

## 2021-04-10 DIAGNOSIS — K5900 Constipation, unspecified: Secondary | ICD-10-CM | POA: Diagnosis not present

## 2021-04-10 DIAGNOSIS — J45909 Unspecified asthma, uncomplicated: Secondary | ICD-10-CM | POA: Insufficient documentation

## 2021-04-10 DIAGNOSIS — Z3A09 9 weeks gestation of pregnancy: Secondary | ICD-10-CM

## 2021-04-10 DIAGNOSIS — A5901 Trichomonal vulvovaginitis: Secondary | ICD-10-CM | POA: Diagnosis not present

## 2021-04-10 DIAGNOSIS — O23591 Infection of other part of genital tract in pregnancy, first trimester: Secondary | ICD-10-CM

## 2021-04-10 DIAGNOSIS — O26899 Other specified pregnancy related conditions, unspecified trimester: Secondary | ICD-10-CM

## 2021-04-10 DIAGNOSIS — O98311 Other infections with a predominantly sexual mode of transmission complicating pregnancy, first trimester: Secondary | ICD-10-CM | POA: Insufficient documentation

## 2021-04-10 DIAGNOSIS — Z3491 Encounter for supervision of normal pregnancy, unspecified, first trimester: Secondary | ICD-10-CM

## 2021-04-10 DIAGNOSIS — O98511 Other viral diseases complicating pregnancy, first trimester: Secondary | ICD-10-CM | POA: Diagnosis not present

## 2021-04-10 DIAGNOSIS — O99511 Diseases of the respiratory system complicating pregnancy, first trimester: Secondary | ICD-10-CM | POA: Diagnosis not present

## 2021-04-10 LAB — URINALYSIS, ROUTINE W REFLEX MICROSCOPIC
Bacteria, UA: NONE SEEN
Bilirubin Urine: NEGATIVE
Glucose, UA: NEGATIVE mg/dL
Hgb urine dipstick: NEGATIVE
Ketones, ur: 20 mg/dL — AB
Nitrite: NEGATIVE
Protein, ur: 30 mg/dL — AB
Specific Gravity, Urine: 1.027 (ref 1.005–1.030)
pH: 7 (ref 5.0–8.0)

## 2021-04-10 LAB — WET PREP, GENITAL
Clue Cells Wet Prep HPF POC: NONE SEEN
Sperm: NONE SEEN
Yeast Wet Prep HPF POC: NONE SEEN

## 2021-04-10 LAB — CBC
HCT: 40.6 % (ref 36.0–46.0)
Hemoglobin: 13.7 g/dL (ref 12.0–15.0)
MCH: 31.1 pg (ref 26.0–34.0)
MCHC: 33.7 g/dL (ref 30.0–36.0)
MCV: 92.1 fL (ref 80.0–100.0)
Platelets: 324 10*3/uL (ref 150–400)
RBC: 4.41 MIL/uL (ref 3.87–5.11)
RDW: 12.4 % (ref 11.5–15.5)
WBC: 4.7 10*3/uL (ref 4.0–10.5)
nRBC: 0 % (ref 0.0–0.2)

## 2021-04-10 LAB — HCG, QUANTITATIVE, PREGNANCY: hCG, Beta Chain, Quant, S: 134934 m[IU]/mL — ABNORMAL HIGH (ref <5)

## 2021-04-10 MED ORDER — METRONIDAZOLE 500 MG PO TABS
2000.0000 mg | ORAL_TABLET | Freq: Once | ORAL | Status: AC
  Administered 2021-04-10: 2000 mg via ORAL
  Filled 2021-04-10: qty 4

## 2021-04-10 NOTE — MAU Note (Signed)
Presents with c/o lower left abdominal pain that last night.  Denies VB.  +HPT.

## 2021-04-10 NOTE — Discharge Instructions (Signed)

## 2021-04-10 NOTE — MAU Provider Note (Signed)
History     CSN: [id number]  Arrival date and time: 04/10/21 1724   Event Date/Time   First Provider Initiated Contact with Patient 04/10/21 1852      Chief Complaint  Patient presents with   Abdominal Pain   HPI Sharon Morris is a 25 y.o. G2P1001 at [redacted]w[redacted]d who presents with abdominal pain. Symptoms started last night & worsened today. Pain in LLQ that she rates 9/10. Hasn't treated symptoms. Denies fever/chills, n/v/d, dysuria, vaginal bleeding, or vaginal discharge. Last BM was over a week ago which she says is normal for her. Doesn't take anything for constipation.   OB History     Gravida  2   Para  1   Term  1   Preterm      AB      Living  1      SAB      IAB      Ectopic      Multiple  0   Live Births  1           Past Medical History:  Diagnosis Date   Anemia    Asthma    Eczema    Herpes    last outbreak in August    Past Surgical History:  Procedure Laterality Date   NO PAST SURGERIES      Family History  Problem Relation Age of Onset   Hypertension Other    Diabetes Other    Cancer Other    Stroke Other    Hypertension Mother    Diabetes Mother    Hypertension Father     Social History   Tobacco Use   Smoking status: Former    Types: Cigars   Smokeless tobacco: Never  Vaping Use   Vaping Use: Never used  Substance Use Topics   Alcohol use: Not Currently   Drug use: Yes    Types: Marijuana    Comment: last smoked 09/12/2019    Allergies: No Known Allergies  Medications Prior to Admission  Medication Sig Dispense Refill Last Dose   albuterol (PROVENTIL HFA;VENTOLIN HFA) 108 (90 Base) MCG/ACT inhaler Inhale 1-2 puffs into the lungs every 6 (six) hours as needed for wheezing or shortness of breath. 1 Inhaler 0    calcium carbonate (TUMS - DOSED IN MG ELEMENTAL CALCIUM) 500 MG chewable tablet Chew 2-3 tablets by mouth as needed for indigestion or heartburn.      cetirizine (ZYRTEC) 10 MG tablet Take 10 mg by mouth  daily as needed for allergies.       ferrous sulfate (FERROUSUL) 325 (65 FE) MG tablet Take 1 tablet (325 mg total) by mouth 2 (two) times daily. (Patient not taking: Reported on 09/02/2019) 60 tablet 1    Prenatal Vit-Fe Phos-FA-Omega (VITAFOL GUMMIES) 3.33-0.333-34.8 MG CHEW Chew 3 each by mouth daily. 90 tablet 12    triamcinolone ointment (KENALOG) 0.1 % Apply 1 application topically daily.  0    valACYclovir (VALTREX) 1000 MG tablet Take 1 tablet (1,000 mg total) by mouth daily. (Patient not taking: Reported on 09/13/2019) 30 tablet 3     Review of Systems  Constitutional: Negative.   Gastrointestinal:  Positive for abdominal pain and constipation. Negative for diarrhea, nausea and vomiting.  Genitourinary: Negative.   Physical Exam   Blood pressure 125/86, pulse (!) 50, temperature 98.1 F (36.7 C), temperature source Oral, resp. rate 16, height 5\' 3"  (1.6 m), weight 81.1 kg, last menstrual period 03/18/2021, SpO2 99 %, unknown if  currently breastfeeding.  Physical Exam Vitals and nursing note reviewed.  Constitutional:      General: She is not in acute distress.    Appearance: She is well-developed.  HENT:     Head: Normocephalic and atraumatic.  Eyes:     General: No scleral icterus. Pulmonary:     Effort: Pulmonary effort is normal. No respiratory distress.  Abdominal:     General: Abdomen is flat.     Palpations: Abdomen is soft.     Tenderness: There is abdominal tenderness in the left lower quadrant. There is no guarding or rebound.  Skin:    General: Skin is warm and dry.  Neurological:     Mental Status: She is alert.  Psychiatric:        Mood and Affect: Mood normal.        Behavior: Behavior normal.    MAU Course  Procedures Results for orders placed or performed during the hospital encounter of 04/10/21 (from the past 24 hour(s))  CBC     Status: None   Collection Time: 04/10/21  5:31 PM  Result Value Ref Range   WBC 4.7 4.0 - 10.5 K/uL   RBC 4.41 3.87  - 5.11 MIL/uL   Hemoglobin 13.7 12.0 - 15.0 g/dL   HCT 71.0 62.6 - 94.8 %   MCV 92.1 80.0 - 100.0 fL   MCH 31.1 26.0 - 34.0 pg   MCHC 33.7 30.0 - 36.0 g/dL   RDW 54.6 27.0 - 35.0 %   Platelets 324 150 - 400 K/uL   nRBC 0.0 0.0 - 0.2 %  hCG, quantitative, pregnancy     Status: Abnormal   Collection Time: 04/10/21  5:31 PM  Result Value Ref Range   hCG, Beta Chain, Quant, S 134,934 (H) <5 mIU/mL  Urinalysis, Routine w reflex microscopic Urine, Clean Catch     Status: Abnormal   Collection Time: 04/10/21  5:51 PM  Result Value Ref Range   Color, Urine YELLOW YELLOW   APPearance HAZY (A) CLEAR   Specific Gravity, Urine 1.027 1.005 - 1.030   pH 7.0 5.0 - 8.0   Glucose, UA NEGATIVE NEGATIVE mg/dL   Hgb urine dipstick NEGATIVE NEGATIVE   Bilirubin Urine NEGATIVE NEGATIVE   Ketones, ur 20 (A) NEGATIVE mg/dL   Protein, ur 30 (A) NEGATIVE mg/dL   Nitrite NEGATIVE NEGATIVE   Leukocytes,Ua SMALL (A) NEGATIVE   RBC / HPF 6-10 0 - 5 RBC/hpf   WBC, UA 11-20 0 - 5 WBC/hpf   Bacteria, UA NONE SEEN NONE SEEN   Squamous Epithelial / LPF 0-5 0 - 5   Mucus PRESENT    Trichomonas, UA PRESENT (A) NONE SEEN  Wet prep, genital     Status: Abnormal   Collection Time: 04/10/21  6:11 PM   Specimen: PATH Cytology Cervicovaginal Ancillary Only  Result Value Ref Range   Yeast Wet Prep HPF POC NONE SEEN NONE SEEN   Trich, Wet Prep PRESENT (A) NONE SEEN   Clue Cells Wet Prep HPF POC NONE SEEN NONE SEEN   WBC, Wet Prep HPF POC FEW (A) NONE SEEN   Sperm NONE SEEN    US OB Comp Less 14 Wks  Result Date: 04/10/2021 CLINICAL DATA:  Left lower quadrant pain for 1 day. EXAM: OBSTETRIC <14 WK ULTRASOUND TECHNIQUE: Transabdominal ultrasound was performed for evaluation of the gestation as well as the maternal uterus and adnexal regions. COMPARISON:  None. FINDINGS: Intrauterine gestational sac: Single Yolk sac:  Not Visualized.  Embryo:  Visualized. Cardiac Activity: Visualized. Heart Rate: 171 bpm CRL:   31 mm    9 w 6 d                  [place] EDC: 11/07/2021 Subchorionic hemorrhage:  None visualized. Maternal uterus/adnexae: Both ovaries are normal in appearance. No mass or abnormal free fluid identified. IMPRESSION: Single living IUP with estimated gestational age of [redacted] weeks 6 days, and [place] EDC of 11/07/2021. No maternal uterine or adnexal abnormality identified. Electronically Signed   By: Danae Orleans M.D.   On: 04/10/2021 19:51    MDM +UPT UA, wet prep, GC/chlamydia, CBC, ABO/Rh, quant hCG, and [place] today to rule out ectopic pregnancy which can be life threatening.   Wet prep positive for trichomonas. Given flagyl 2 gm in MAU. Discussed that partner should go to [facility] for testing & treatment. Added to problem list.   Ultrasound shows live IUP measuring [redacted]w[redacted]d - EDD updated  Assessment and Plan   1. Abdominal pain affecting pregnancy  -likely related to infection vs constipation -reviewed SAB precautions   2. Trichomonal vaginitis during pregnancy in first trimester  -no intercourse x 2 weeks -partner to go to [facility] for treatment -tx with flagyl in MAU  3. Constipation during pregnancy in first trimester  -start taking colace daily  4. Normal IUP (intrauterine pregnancy) on prenatal ultrasound, first trimester   5. [redacted] weeks gestation of pregnancy      Judeth Horn 04/10/2021, 7:59 PM

## 2021-04-10 NOTE — Progress Notes (Signed)
Judeth Horn NP in earlier to discuss test results and d/c plan with pt. WRitten and verbal d/c instructions given and understanding voiced.

## 2021-04-11 LAB — GC/CHLAMYDIA PROBE AMP (~~LOC~~) NOT AT ARMC
Chlamydia: NEGATIVE
Comment: NEGATIVE
Comment: NORMAL
Neisseria Gonorrhea: NEGATIVE

## 2021-04-22 ENCOUNTER — Other Ambulatory Visit: Payer: Self-pay

## 2021-04-22 ENCOUNTER — Ambulatory Visit (INDEPENDENT_AMBULATORY_CARE_PROVIDER_SITE_OTHER): Payer: Medicaid Other | Admitting: *Deleted

## 2021-04-22 VITALS — BP 113/78 | HR 97 | Temp 98.1°F | Ht 63.0 in | Wt 177.6 lb

## 2021-04-22 DIAGNOSIS — K5900 Constipation, unspecified: Secondary | ICD-10-CM

## 2021-04-22 DIAGNOSIS — Z3480 Encounter for supervision of other normal pregnancy, unspecified trimester: Secondary | ICD-10-CM

## 2021-04-22 MED ORDER — DOCUSATE SODIUM 100 MG PO CAPS: 100.0000 mg | ORAL_CAPSULE | Freq: Two times a day (BID) | ORAL | 0 refills | Status: AC

## 2021-04-22 MED ORDER — BLOOD PRESSURE MONITOR AUTOMAT DEVI: 1.0000 | Freq: Every day | 0 refills | Status: AC

## 2021-04-22 MED ORDER — VITAFOL GUMMIES 3.33-0.333-34.8 MG PO CHEW: 3.0000 | CHEWABLE_TABLET | Freq: Every day | ORAL | 12 refills | Status: AC

## 2021-04-22 MED ORDER — GOJJI WEIGHT SCALE MISC: 1.0000 | Freq: Every day | 0 refills | Status: AC | PRN

## 2021-04-22 NOTE — Patient Instructions (Signed)
   Genetic Screening Results Information: You are having genetic testing called [facility] today.  It will take approximately 2 weeks before the results are available.  To get your results, you need Internet access to a web browser to search [place]/MyChart (the direct app on your phone will not give you these results).  Then select Lab Scanned and click on the blue hyper link that says View Image to see your [facility] results.  You can also use the directions on the purple card given to look up your results directly on the [facility] website.  

## 2021-04-22 NOTE — Progress Notes (Signed)
   Location: [facility] Patient: clinic Provider: clinic  PRENATAL INTAKE SUMMARY  Sharon Morris presents today New OB Nurse Interview.  OB History     Gravida  2   Para  1   Term  1   Preterm      AB      Living  1      SAB      IAB      Ectopic      Multiple  0   Live Births  1          I have reviewed the patient's medical, obstetrical, social, and family histories, medications, and available lab results.  SUBJECTIVE She complains of constipation and decrease appetite (marijuana once daily). History of HSV.  OBJECTIVE Initial Nurse interview for history/labs (New OB)  EDD: 11/07/2020 by early ultrasound GA: [redacted]w[redacted]d G2P1001 FHT: 159  GENERAL APPEARANCE: alert, well appearing, in no apparent distress, oriented to person, place and time   ASSESSMENT Normal pregnancy  PLAN Prenatal care:  [facility] Pnl/HIV/Hep C OB Urine Culture GC/CT-to be completed at next visit with Edd Arbour, CNM HgbEval/SMA/CF ([facility])-Negative prior pregnancy [facility] A1C Ultrasound MFM >14 weeks complete Rx [facility] for blood pressure monitor and weight scale Rx for Colace 100 mg 1 tab PO BID for constipation Rx for PNV    Follow Up Instructions:   I discussed the assessment and treatment plan with the patient. The patient was provided an opportunity to ask questions and all were answered. The patient agreed with the plan and demonstrated an understanding of the instructions.   The patient was advised to call back or seek an in-person evaluation if the symptoms worsen or if the condition fails to improve as anticipated.  I provided 40 minutes of  face-to-face time during this encounter.  Clovis Pu, RN

## 2021-04-23 LAB — OBSTETRIC PANEL, INCLUDING HIV
Antibody Screen: NEGATIVE
Basophils Absolute: 0 10*3/uL (ref 0.0–0.2)
Basos: 0 %
EOS (ABSOLUTE): 0.1 10*3/uL (ref 0.0–0.4)
Eos: 2 %
HIV Screen 4th Generation wRfx: NONREACTIVE
Hematocrit: 41 % (ref 34.0–46.6)
Hemoglobin: 13.6 g/dL (ref 11.1–15.9)
Hepatitis B Surface Ag: NEGATIVE
Immature Grans (Abs): 0 10*3/uL (ref 0.0–0.1)
Immature Granulocytes: 0 %
Lymphocytes Absolute: 1.4 10*3/uL (ref 0.7–3.1)
Lymphs: 31 %
MCH: 30.8 pg (ref 26.6–33.0)
MCHC: 33.2 g/dL (ref 31.5–35.7)
MCV: 93 fL (ref 79–97)
Monocytes Absolute: 0.3 10*3/uL (ref 0.1–0.9)
Monocytes: 5 %
Neutrophils Absolute: 2.9 10*3/uL (ref 1.4–7.0)
Neutrophils: 62 %
Platelets: 293 10*3/uL (ref 150–450)
RBC: 4.41 x10E6/uL (ref 3.77–5.28)
RDW: 13 % (ref 11.7–15.4)
RPR Ser Ql: NONREACTIVE
Rh Factor: POSITIVE
Rubella Antibodies, IGG: 3.61 index (ref >0.99)
WBC: 4.7 10*3/uL (ref 3.4–10.8)

## 2021-04-23 LAB — HEMOGLOBIN A1C
Est. average glucose Bld gHb Est-mCnc: 105 mg/dL
Hgb A1c MFr Bld: 5.3 % (ref 4.8–5.6)

## 2021-04-23 LAB — HEPATITIS C ANTIBODY: Hep C Virus Ab: <0.1 s/co ratio (ref 0.0–0.9)

## 2021-04-24 LAB — CULTURE, OB URINE

## 2021-04-24 LAB — URINE CULTURE, OB REFLEX

## 2021-05-01 ENCOUNTER — Encounter: Payer: Medicaid Other | Admitting: Certified Nurse Midwife

## 2021-05-22 ENCOUNTER — Encounter: Payer: Self-pay | Admitting: Certified Nurse Midwife

## 2021-06-13 ENCOUNTER — Ambulatory Visit: Payer: Medicaid Other | Attending: Certified Nurse Midwife

## 2022-01-28 IMAGING — US US OB COMP LESS 14 WK
1 series · 15 of 28 positions shown · non-contrast
Comparison: None.

CLINICAL DATA: Left lower quadrant pain for 1 day.

EXAM:
OBSTETRIC <14 WK ULTRASOUND
TECHNIQUE: Transabdominal ultrasound was performed for evaluation of the
gestation as well as the maternal uterus and adnexal regions.

[Series 1: us ob comp less 14 wk · 39 acquisitions, 15 frames shown]
[im 1/39]
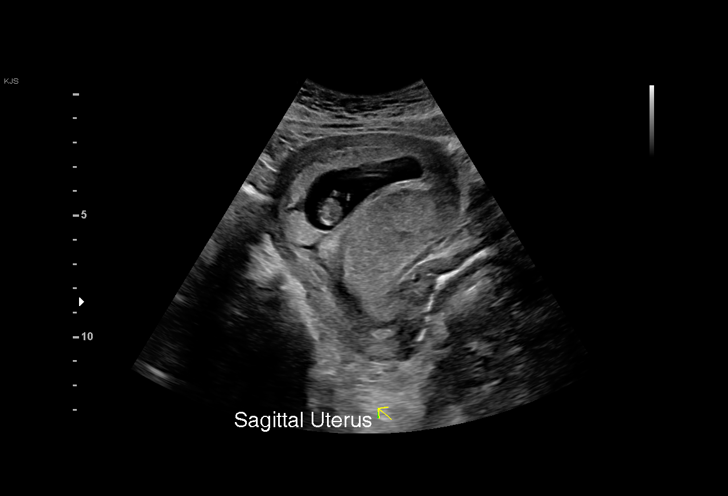
[im 3/39]
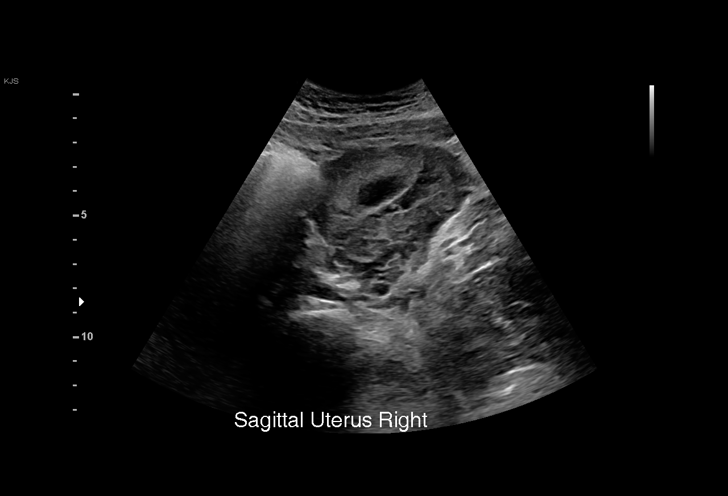
[im 6/39]
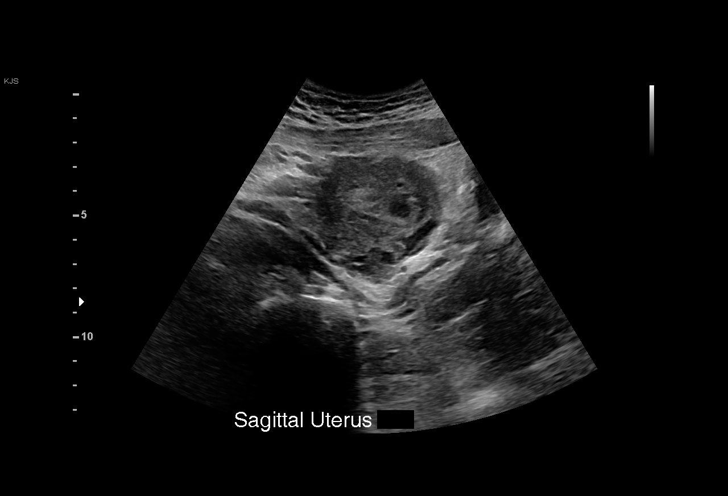
[im 9/39]
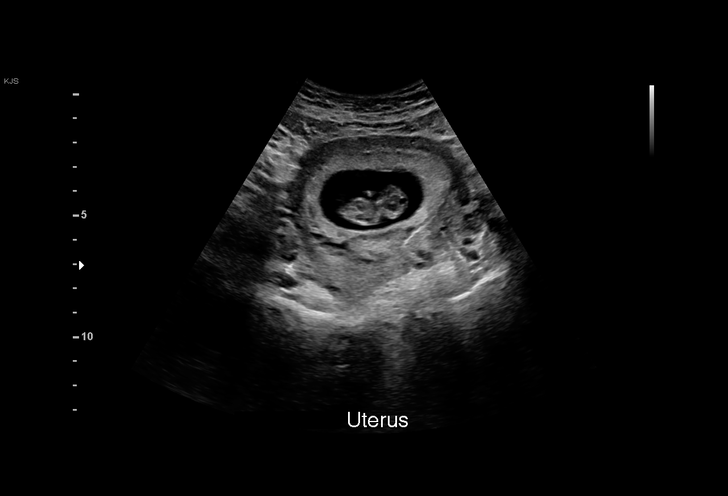
[im 12/39]
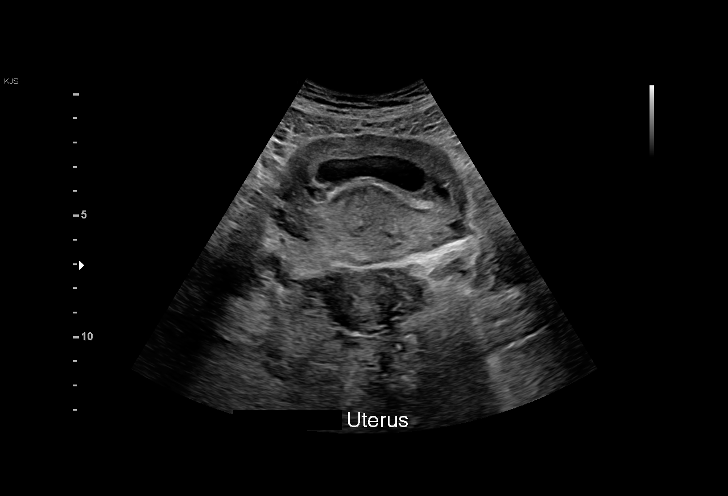
[im 15/39]
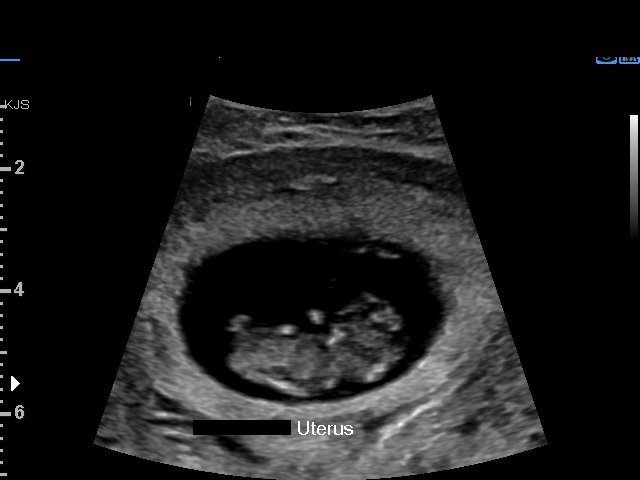
[im 17/39]
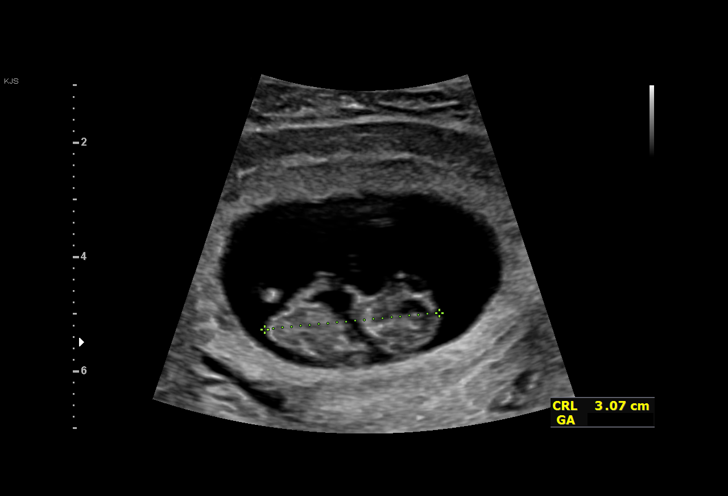
[im 20/39]
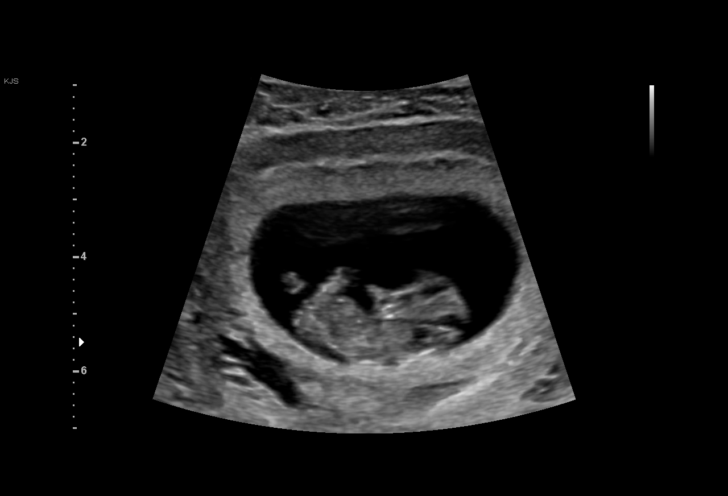
[im 22/39]
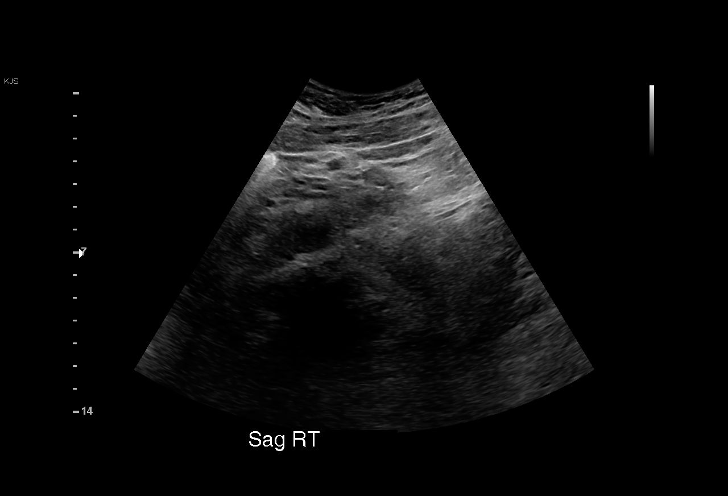
[im 24/39]
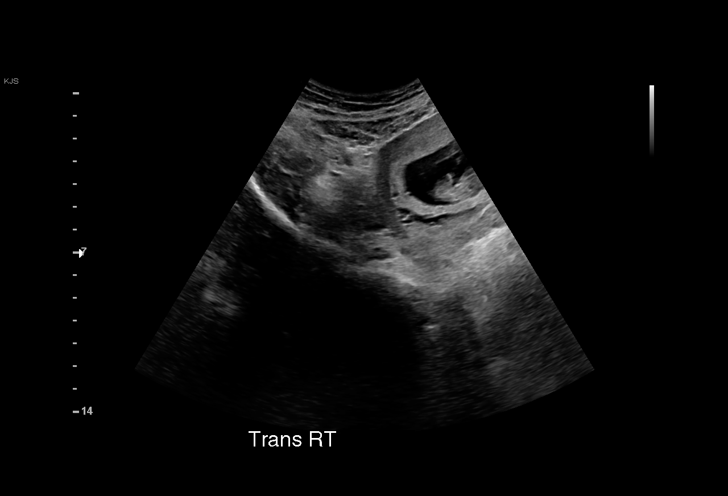
[im 27/39]
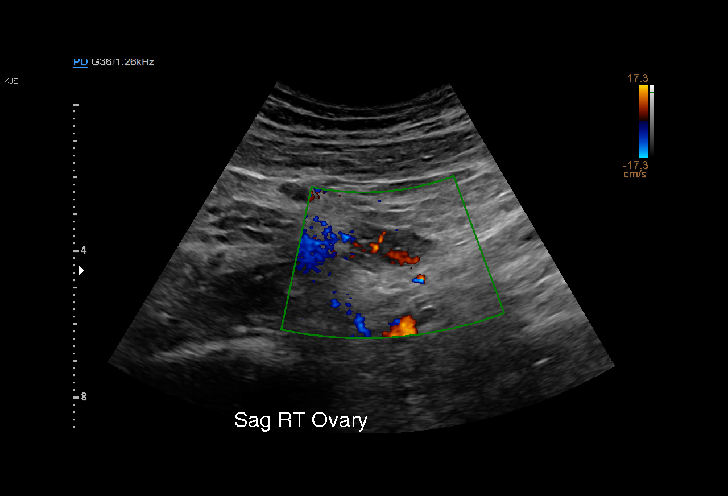
[im 30/39]
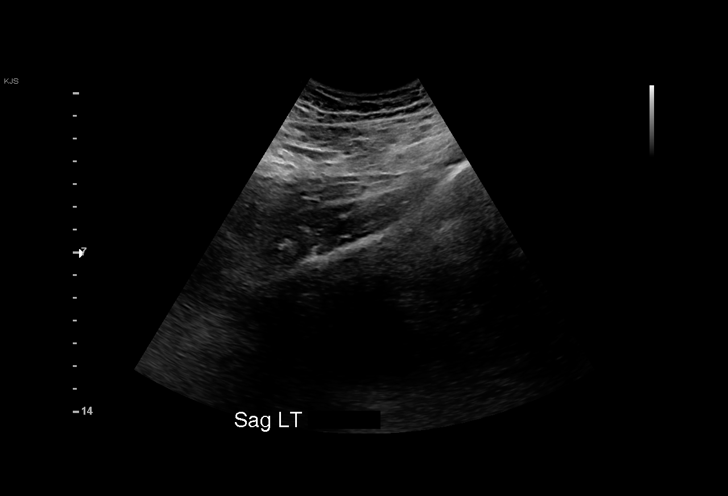
[im 33/39]
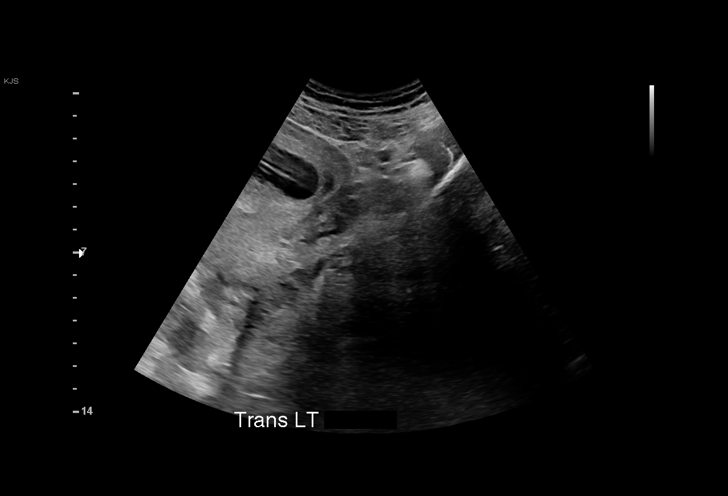
[im 36/39]
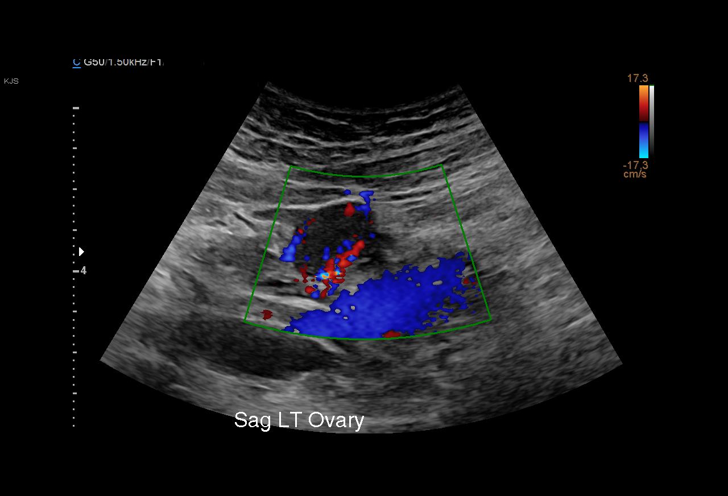
[im 39/39]
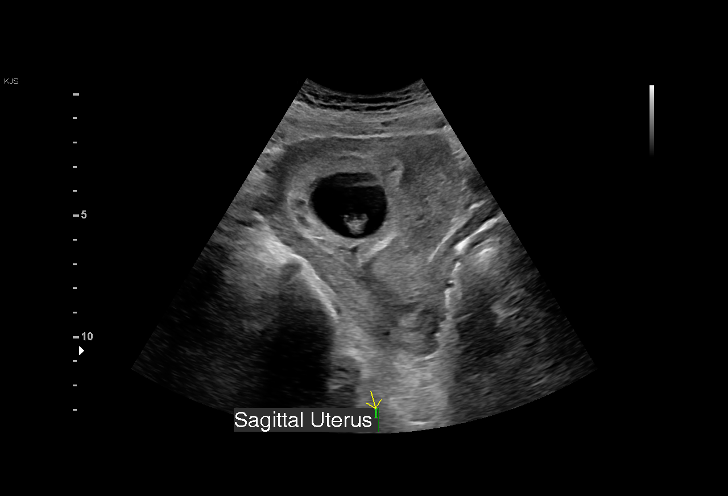

[15 of 28 positions shown; findings below may reference images not displayed]

FINDINGS: Intrauterine gestational sac: Single

Yolk sac:  Not Visualized.

Embryo:  Visualized.

Cardiac Activity: Visualized.

Heart Rate: 171 bpm

CRL:   31 mm   9 w 6 d                  US EDC: 11/07/2021

Subchorionic hemorrhage:  None visualized.

Maternal uterus/adnexae: Both ovaries are normal in appearance. No
mass or abnormal free fluid identified.
IMPRESSION: Single living IUP with estimated gestational age of 9 weeks 6 days,
and US EDC of 11/07/2021.

No maternal uterine or adnexal abnormality identified.

## 2022-04-16 ENCOUNTER — Other Ambulatory Visit: Payer: Self-pay

## 2022-04-16 ENCOUNTER — Encounter (HOSPITAL_BASED_OUTPATIENT_CLINIC_OR_DEPARTMENT_OTHER): Payer: Self-pay | Admitting: Emergency Medicine

## 2022-04-16 ENCOUNTER — Emergency Department (HOSPITAL_BASED_OUTPATIENT_CLINIC_OR_DEPARTMENT_OTHER)
Admission: EM | Admit: 2022-04-16 | Discharge: 2022-04-16 | Disposition: A | Discharge status: Home / Self Care | Payer: Medicaid Other | Attending: Emergency Medicine | Admitting: Emergency Medicine

## 2022-04-16 DIAGNOSIS — E876 Hypokalemia: Secondary | ICD-10-CM

## 2022-04-16 DIAGNOSIS — Z20822 Contact with and (suspected) exposure to covid-19: Secondary | ICD-10-CM | POA: Insufficient documentation

## 2022-04-16 DIAGNOSIS — N3000 Acute cystitis without hematuria: Secondary | ICD-10-CM | POA: Diagnosis not present

## 2022-04-16 DIAGNOSIS — R509 Fever, unspecified: Secondary | ICD-10-CM | POA: Diagnosis present

## 2022-04-16 LAB — URINALYSIS, ROUTINE W REFLEX MICROSCOPIC
Glucose, UA: NEGATIVE mg/dL
Ketones, ur: >=80 mg/dL — AB
Nitrite: NEGATIVE
Protein, ur: 100 mg/dL — AB
Specific Gravity, Urine: 1.025 (ref 1.005–1.030)
pH: 6.5 (ref 5.0–8.0)

## 2022-04-16 LAB — SARS CORONAVIRUS 2 BY RT PCR: SARS Coronavirus 2 by RT PCR: NEGATIVE

## 2022-04-16 LAB — BASIC METABOLIC PANEL
Anion gap: 14 (ref 5–15)
BUN: 8 mg/dL (ref 6–20)
CO2: 27 mmol/L (ref 22–32)
Calcium: 8.5 mg/dL — ABNORMAL LOW (ref 8.9–10.3)
Chloride: 95 mmol/L — ABNORMAL LOW (ref 98–111)
Creatinine, Ser: 0.76 mg/dL (ref 0.44–1.00)
GFR, Estimated: >60 mL/min (ref >60)
Glucose, Bld: 79 mg/dL (ref 70–99)
Potassium: 2.9 mmol/L — ABNORMAL LOW (ref 3.5–5.1)
Sodium: 136 mmol/L (ref 135–145)

## 2022-04-16 LAB — PREGNANCY, URINE: Preg Test, Ur: NEGATIVE

## 2022-04-16 LAB — CBC
HCT: 39.9 % (ref 36.0–46.0)
Hemoglobin: 13.9 g/dL (ref 12.0–15.0)
MCH: 34.2 pg — ABNORMAL HIGH (ref 26.0–34.0)
MCHC: 34.8 g/dL (ref 30.0–36.0)
MCV: 98.3 fL (ref 80.0–100.0)
Platelets: 211 10*3/uL (ref 150–400)
RBC: 4.06 MIL/uL (ref 3.87–5.11)
RDW: 14 % (ref 11.5–15.5)
WBC: 6.4 10*3/uL (ref 4.0–10.5)
nRBC: 0 % (ref 0.0–0.2)

## 2022-04-16 LAB — URINALYSIS, MICROSCOPIC (REFLEX): WBC, UA: >50 WBC/hpf (ref 0–5)

## 2022-04-16 MED ORDER — DOXYCYCLINE HYCLATE 100 MG PO CAPS: 100.0000 mg | ORAL_CAPSULE | Freq: Two times a day (BID) | ORAL | 0 refills | Status: DC

## 2022-04-16 MED ORDER — ONDANSETRON HCL 4 MG/2ML IJ SOLN
4.0000 mg | Freq: Once | INTRAMUSCULAR | Status: AC
  Administered 2022-04-16: 4 mg via INTRAVENOUS
  Filled 2022-04-16: qty 2

## 2022-04-16 MED ORDER — DOXYCYCLINE HYCLATE 100 MG PO CAPS: 100.0000 mg | ORAL_CAPSULE | Freq: Two times a day (BID) | ORAL | 0 refills | Status: AC

## 2022-04-16 MED ORDER — POTASSIUM CHLORIDE CRYS ER 20 MEQ PO TBCR: 20.0000 meq | EXTENDED_RELEASE_TABLET | Freq: Two times a day (BID) | ORAL | 0 refills | Status: DC

## 2022-04-16 MED ORDER — SODIUM CHLORIDE 0.9 % IV BOLUS
1000.0000 mL | Freq: Once | INTRAVENOUS | Status: AC
  Administered 2022-04-16: 1000 mL via INTRAVENOUS

## 2022-04-16 MED ORDER — POTASSIUM CHLORIDE CRYS ER 20 MEQ PO TBCR
40.0000 meq | EXTENDED_RELEASE_TABLET | Freq: Once | ORAL | Status: AC
  Administered 2022-04-16: 40 meq via ORAL
  Filled 2022-04-16: qty 2

## 2022-04-16 MED ORDER — NITROFURANTOIN MONOHYD MACRO 100 MG PO CAPS: 100.0000 mg | ORAL_CAPSULE | Freq: Two times a day (BID) | ORAL | 0 refills | Status: AC

## 2022-04-16 MED ORDER — POTASSIUM CHLORIDE CRYS ER 20 MEQ PO TBCR: 20.0000 meq | EXTENDED_RELEASE_TABLET | Freq: Two times a day (BID) | ORAL | 0 refills | Status: AC

## 2022-04-16 MED ORDER — METRONIDAZOLE 500 MG PO TABS
2000.0000 mg | ORAL_TABLET | Freq: Once | ORAL | Status: AC
  Administered 2022-04-16: 2000 mg via ORAL
  Filled 2022-04-16: qty 4

## 2022-04-16 MED ORDER — NITROFURANTOIN MONOHYD MACRO 100 MG PO CAPS: 100.0000 mg | ORAL_CAPSULE | Freq: Two times a day (BID) | ORAL | 0 refills | Status: DC

## 2022-04-16 MED ORDER — SODIUM CHLORIDE 0.9 % IV SOLN
1.0000 g | Freq: Once | INTRAVENOUS | Status: AC
  Administered 2022-04-16: 1 g via INTRAVENOUS
  Filled 2022-04-16: qty 10

## 2022-04-16 NOTE — Discharge Instructions (Addendum)
Get help right away if: You have severe pain in your back or your lower abdomen. You have a fever or chills. You have nausea or vomiting. 

## 2022-04-16 NOTE — ED Triage Notes (Signed)
Fever, chills, n/v/d since Sunday. Denies abdominal pain Endorses burning with urination and hematuria.

## 2022-04-16 NOTE — ED Provider Notes (Signed)
[facility] EMERGENCY DEPARTMENT Provider Note   CSN: [id number] Arrival date & time: 04/16/22  1532     History  Chief Complaint  Patient presents with   Fever    Sharon Morris is a 26 y.o. female who presents emergency department with 5 days of fever, lower abdominal pain, now flank pain for the past couple days.  She has had nausea vomiting and diarrhea at home.  She denies any vaginal symptoms.  She denies abdominal pain.   Fever      Home Medications Prior to Admission medications   Medication Sig Start Date End Date Taking? Authorizing Provider  Blood Pressure Monitoring (BLOOD PRESSURE MONITOR AUTOMAT) DEVI 1 Device by Does not apply route daily. Automatic blood pressure cuff regular size. To monitor blood pressure regularly at home. ICD-10 code:Z34.90 04/22/21   Edd Arbour R, CNM  docusate sodium (COLACE) 100 MG capsule Take 1 capsule (100 mg total) by mouth 2 (two) times daily. 04/22/21   Bernerd Limbo, CNM  Misc. Devices (GOJJI WEIGHT SCALE) MISC 1 Device by Does not apply route daily as needed. To weight self daily as needed at home. ICD-10 code: Z34.90 04/22/21   Bernerd Limbo, CNM  Prenatal Vit-Fe Phos-FA-Omega (VITAFOL GUMMIES) 3.33-0.333-34.8 MG CHEW Chew 3 each by mouth daily. 04/22/21   Bernerd Limbo, CNM      Allergies    Patient has no known allergies.    Review of Systems   Review of Systems  Constitutional:  Positive for fever.    Physical Exam Updated Vital Signs BP (!) 130/99   Pulse 88   Temp 98 F (36.7 C)   Resp 17   Ht 5\' 3"  (1.6 m)   Wt 81.6 kg   LMP 04/01/2022 (Exact Date)   SpO2 100%   BMI 31.89 kg/m  Physical Exam Vitals and nursing note reviewed.  Constitutional:      General: She is not in acute distress.    Appearance: She is well-developed. She is not diaphoretic.  HENT:     Head: Normocephalic and atraumatic.     Right Ear: External ear normal.     Left Ear: External ear normal.     Nose: Nose  normal.     Mouth/Throat:     Mouth: Mucous membranes are moist.  Eyes:     General: No scleral icterus.    Conjunctiva/sclera: Conjunctivae normal.  Cardiovascular:     Rate and Rhythm: Normal rate and regular rhythm.     Heart sounds: Normal heart sounds. No murmur heard.    No friction rub. No gallop.  Pulmonary:     Effort: Pulmonary effort is normal. No respiratory distress.     Breath sounds: Normal breath sounds.  Abdominal:     General: Bowel sounds are normal. There is no distension.     Palpations: Abdomen is soft. There is no mass.     Tenderness: There is no abdominal tenderness. There is no right CVA tenderness, left CVA tenderness or guarding.  Musculoskeletal:     Cervical back: Normal range of motion.  Skin:    General: Skin is warm and dry.  Neurological:     Mental Status: She is alert and oriented to person, place, and time.  Psychiatric:        Behavior: Behavior normal.     ED Results / Procedures / Treatments   Labs (all labs ordered are listed, but only abnormal results are displayed) Labs Reviewed  URINALYSIS,  ROUTINE W REFLEX MICROSCOPIC - Abnormal; Notable for the following components:      Result Value   APPearance CLOUDY (*)    Hgb urine dipstick MODERATE (*)    Bilirubin Urine MODERATE (*)    Ketones, ur >=80 (*)    Protein, ur 100 (*)    Leukocytes,Ua LARGE (*)    All other components within normal limits  CBC - Abnormal; Notable for the following components:   MCH 34.2 (*)    All other components within normal limits  URINALYSIS, MICROSCOPIC (REFLEX) - Abnormal; Notable for the following components:   Bacteria, UA MANY (*)    Trichomonas, UA PRESENT (*)    All other components within normal limits  BASIC METABOLIC PANEL - Abnormal; Notable for the following components:   Potassium 2.9 (*)    Chloride 95 (*)    Calcium 8.5 (*)    All other components within normal limits  SARS CORONAVIRUS 2 BY RT PCR  PREGNANCY, URINE     EKG None  Radiology No results found.  Procedures Procedures    Medications Ordered in ED Medications  sodium chloride 0.9 % bolus 1,000 mL (1,000 mLs Intravenous New Bag/Given 04/16/22 1903)  cefTRIAXone (ROCEPHIN) 1 g in sodium chloride 0.9 % 100 mL IVPB (0 g Intravenous Stopped 04/16/22 1939)  metroNIDAZOLE (FLAGYL) tablet 2,000 mg (2,000 mg Oral Given 04/16/22 1900)  ondansetron (ZOFRAN) injection 4 mg (4 mg Intravenous Given 04/16/22 1856)    ED Course/ Medical Decision Making/ A&P Clinical Course as of 04/16/22 1947  Wed Apr 16, 2022  1947 Potassium(!): 2.9 [AH]    Clinical Course User Index [AH] Arthor Captain, PA-C                           Medical Decision Making Patient here with fever flank pain and dysuria.  Review of work-up shows she has trichomoniasis cystitis.  Does not appear to have active pyelonephritis, kidney stone. Patient treated here with Rocephin, Flagyl, and doxycycline.  Will discharge on doxycycline and Macrobid.  Patient also noted to be hypokalemic was repleted orally and she will be discharged with the same.  Patient appears otherwise appropriate for discharge, safe sex practices discussed, treatment with partners discussed.  Return precautions discussed  Amount and/or Complexity of Data Reviewed Labs: ordered. Decision-making details documented in ED Course.  Risk Prescription drug management.           Final Clinical Impression(s) / ED Diagnoses Final diagnoses:  None    Rx / DC Orders ED Discharge Orders     None         Arthor Captain, PA-C 04/16/22 2331    Terrilee Files, MD 04/17/22 1416

## 2023-05-04 ENCOUNTER — Other Ambulatory Visit: Payer: Self-pay

## 2023-05-04 ENCOUNTER — Emergency Department (HOSPITAL_BASED_OUTPATIENT_CLINIC_OR_DEPARTMENT_OTHER)
Admission: EM | Admit: 2023-05-04 | Discharge: 2023-05-04 | Disposition: A | Discharge status: Home / Self Care | Payer: Medicaid Other | Attending: Emergency Medicine | Admitting: Emergency Medicine

## 2023-05-04 ENCOUNTER — Encounter (HOSPITAL_BASED_OUTPATIENT_CLINIC_OR_DEPARTMENT_OTHER): Payer: Self-pay

## 2023-05-04 DIAGNOSIS — E876 Hypokalemia: Secondary | ICD-10-CM

## 2023-05-04 DIAGNOSIS — N3000 Acute cystitis without hematuria: Secondary | ICD-10-CM | POA: Diagnosis not present

## 2023-05-04 DIAGNOSIS — E860 Dehydration: Secondary | ICD-10-CM | POA: Diagnosis not present

## 2023-05-04 DIAGNOSIS — R112 Nausea with vomiting, unspecified: Secondary | ICD-10-CM | POA: Diagnosis not present

## 2023-05-04 DIAGNOSIS — R531 Weakness: Secondary | ICD-10-CM | POA: Diagnosis present

## 2023-05-04 DIAGNOSIS — Z1152 Encounter for screening for COVID-19: Secondary | ICD-10-CM | POA: Diagnosis not present

## 2023-05-04 LAB — CBC
HCT: 35.7 % — ABNORMAL LOW (ref 36.0–46.0)
Hemoglobin: 12.3 g/dL (ref 12.0–15.0)
MCH: 33.8 pg (ref 26.0–34.0)
MCHC: 34.5 g/dL (ref 30.0–36.0)
MCV: 98.1 fL (ref 80.0–100.0)
Platelets: 382 10*3/uL (ref 150–400)
RBC: 3.64 MIL/uL — ABNORMAL LOW (ref 3.87–5.11)
RDW: 13.6 % (ref 11.5–15.5)
WBC: 7.5 10*3/uL (ref 4.0–10.5)
nRBC: 0 % (ref 0.0–0.2)

## 2023-05-04 LAB — COMPREHENSIVE METABOLIC PANEL
ALT: 21 U/L (ref 0–44)
AST: 63 U/L — ABNORMAL HIGH (ref 15–41)
Albumin: 4.1 g/dL (ref 3.5–5.0)
Alkaline Phosphatase: 77 U/L (ref 38–126)
Anion gap: 16 — ABNORMAL HIGH (ref 5–15)
BUN: <5 mg/dL — ABNORMAL LOW (ref 6–20)
CO2: 24 mmol/L (ref 22–32)
Calcium: 8.9 mg/dL (ref 8.9–10.3)
Chloride: 98 mmol/L (ref 98–111)
Creatinine, Ser: 0.83 mg/dL (ref 0.44–1.00)
GFR, Estimated: >60 mL/min (ref >60)
Glucose, Bld: 90 mg/dL (ref 70–99)
Potassium: 3.1 mmol/L — ABNORMAL LOW (ref 3.5–5.1)
Sodium: 138 mmol/L (ref 135–145)
Total Bilirubin: 1.7 mg/dL — ABNORMAL HIGH (ref 0.3–1.2)
Total Protein: 8.2 g/dL — ABNORMAL HIGH (ref 6.5–8.1)

## 2023-05-04 LAB — URINALYSIS, ROUTINE W REFLEX MICROSCOPIC
Glucose, UA: NEGATIVE mg/dL
Ketones, ur: 80 mg/dL — AB
Nitrite: NEGATIVE
Protein, ur: 100 mg/dL — AB
Specific Gravity, Urine: 1.02 (ref 1.005–1.030)
pH: 8 (ref 5.0–8.0)

## 2023-05-04 LAB — RESP PANEL BY RT-PCR (RSV, FLU A&B, COVID)  RVPGX2
Influenza A by PCR: NEGATIVE
Influenza B by PCR: NEGATIVE
Resp Syncytial Virus by PCR: NEGATIVE
SARS Coronavirus 2 by RT PCR: NEGATIVE

## 2023-05-04 LAB — RAPID URINE DRUG SCREEN, HOSP PERFORMED
Amphetamines: POSITIVE — AB
Barbiturates: NOT DETECTED
Benzodiazepines: NOT DETECTED
Cocaine: NOT DETECTED
Opiates: NOT DETECTED
Tetrahydrocannabinol: NOT DETECTED

## 2023-05-04 LAB — URINALYSIS, MICROSCOPIC (REFLEX)

## 2023-05-04 LAB — GROUP A STREP BY PCR: Group A Strep by PCR: NOT DETECTED

## 2023-05-04 LAB — LIPASE, BLOOD: Lipase: 21 U/L (ref 11–51)

## 2023-05-04 LAB — ETHANOL: Alcohol, Ethyl (B): <10 mg/dL (ref <10)

## 2023-05-04 LAB — PREGNANCY, URINE: Preg Test, Ur: NEGATIVE

## 2023-05-04 MED ORDER — LACTATED RINGERS IV BOLUS
1000.0000 mL | Freq: Once | INTRAVENOUS | Status: AC
  Administered 2023-05-04: 1000 mL via INTRAVENOUS

## 2023-05-04 MED ORDER — SODIUM CHLORIDE 0.9 % IV SOLN
1.0000 g | Freq: Once | INTRAVENOUS | Status: AC
  Administered 2023-05-04: 1 g via INTRAVENOUS
  Filled 2023-05-04: qty 10

## 2023-05-04 MED ORDER — CEPHALEXIN 500 MG PO CAPS: 500.0000 mg | ORAL_CAPSULE | Freq: Four times a day (QID) | ORAL | 0 refills | Status: AC

## 2023-05-04 MED ORDER — ONDANSETRON 4 MG PO TBDP
4.0000 mg | ORAL_TABLET | Freq: Once | ORAL | Status: AC | PRN
Start: 2023-05-04 — End: 2023-05-04
  Administered 2023-05-04: 4 mg via ORAL
  Filled 2023-05-04: qty 1

## 2023-05-04 MED ORDER — PANTOPRAZOLE SODIUM 40 MG IV SOLR
40.0000 mg | Freq: Once | INTRAVENOUS | Status: AC
  Administered 2023-05-04: 40 mg via INTRAVENOUS
  Filled 2023-05-04: qty 10

## 2023-05-04 MED ORDER — OMEPRAZOLE 20 MG PO CPDR: 20.0000 mg | DELAYED_RELEASE_CAPSULE | Freq: Every day | ORAL | 0 refills | Status: AC

## 2023-05-04 MED ORDER — POTASSIUM CHLORIDE 10 MEQ/100ML IV SOLN
10.0000 meq | INTRAVENOUS | Status: AC
  Administered 2023-05-04 (×2): 10 meq via INTRAVENOUS
  Filled 2023-05-04 (×2): qty 100

## 2023-05-04 MED ORDER — SODIUM CHLORIDE 0.9 % IV SOLN: INTRAVENOUS | Status: DC | PRN

## 2023-05-04 MED ORDER — POTASSIUM CHLORIDE CRYS ER 20 MEQ PO TBCR: 20.0000 meq | EXTENDED_RELEASE_TABLET | Freq: Two times a day (BID) | ORAL | 0 refills | Status: AC

## 2023-05-04 MED ORDER — ONDANSETRON 4 MG PO TBDP: 4.0000 mg | ORAL_TABLET | Freq: Three times a day (TID) | ORAL | 0 refills | Status: AC | PRN

## 2023-05-04 NOTE — Discharge Instructions (Addendum)
1.  Your urine test positive for infection.  A culture is being done to confirm this and to identify if there is a specific antibiotic he should have.  Given a dose of Rocephin in the emergency department.  This antibiotic is good for 24 hours.  Fill the prescription for Keflex and start tomorrow evening. 2.  You likely have gastritis.  This is an inflammation of the stomach wall.  Regular alcohol use will cause this, you may also experience reflux and vomiting.  At this time start taking omeprazole daily.  This will help with stomach inflammation.  Try to cut back and discontinue regular alcohol use.  Try to eat a healthy diet with small meals at regular intervals.  This should help you feel better in terms of energy level and is an overall healthier eating pattern. 3.  Your potassium was low.  You have been given a potassium supplement to take for the next week or so.  You need a recheck with your doctor to make sure that this is going back to normal as you improve your diet and decrease alcohol use. 4.  It is very important that you get close follow-up and monitoring for medical conditions.  A resource guide has been included in your discharge instructions to help you find low-cost medical care. 5.  Return to the emergency department if you have worsening symptoms or other new concerning symptoms.

## 2023-05-04 NOTE — ED Provider Notes (Signed)
[place] EMERGENCY DEPARTMENT AT [facility] Provider Note   CSN: [id number] Arrival date & time: 05/04/23  1256     History  Chief Complaint  Patient presents with   Emesis   Sore Throat   Weakness    Sharon Morris is a 27 y.o. female.  HPI Patient reports she felt a little off yesterday.  She did have usual level of energy but did not think she was sick.  Patient reports she awoke this morning and started having vomiting immediately.  She reports she vomited 4 times and remained very nauseated.  She reports after multiple episodes of vomiting she now also has sore throat.  Patient reports she feels achy and generally weak.  She denies any diarrhea.  Patient reports that she typically only eats about every 3 to 4 days.  She reports the last time she ate anything was Thursday, 3 days ago.  Reports she stays hydrated with water and Gatorade.  She sometimes will drink Sprite or ginger ale for stomach feels nauseated.  Patient reports that on a daily basis she does drink about 4 small can of [employer] beverages daily.  Patient reports sometimes she does get burning reflux type symptoms.  She reports she will drink a carbonated beverage and belch and feel better.  She does not take any regular medications for this.  Patient denies marijuana use.  She denies any other medical problems.  She does have eczema which she periodically treats with steroid cream.    Home Medications Prior to Admission medications   Medication Sig Start Date End Date Taking? Authorizing Provider  cephALEXin (KEFLEX) 500 MG capsule Take 1 capsule (500 mg total) by mouth 4 (four) times daily. 05/04/23  Yes Arby Barrette, MD  omeprazole (PRILOSEC) 20 MG capsule Take 1 capsule (20 mg total) by mouth daily. 05/04/23  Yes Arby Barrette, MD  ondansetron (ZOFRAN-ODT) 4 MG disintegrating tablet Take 1 tablet (4 mg total) by mouth every 8 (eight) hours as needed for nausea or vomiting. 05/04/23  Yes Gregg Winchell,  Lebron Conners, MD  potassium chloride SA (KLOR-CON M) 20 MEQ tablet Take 1 tablet (20 mEq total) by mouth 2 (two) times daily. 05/04/23  Yes Arby Barrette, MD  Blood Pressure Monitoring (BLOOD PRESSURE MONITOR AUTOMAT) DEVI 1 Device by Does not apply route daily. Automatic blood pressure cuff regular size. To monitor blood pressure regularly at home. ICD-10 code:Z34.90 04/22/21   Edd Arbour R, CNM  docusate sodium (COLACE) 100 MG capsule Take 1 capsule (100 mg total) by mouth 2 (two) times daily. 04/22/21   Bernerd Limbo, CNM  doxycycline (VIBRAMYCIN) 100 MG capsule Take 1 capsule (100 mg total) by mouth 2 (two) times daily. One po bid x 7 days 04/16/22   Arthor Captain, PA-C  Misc. Devices (GOJJI WEIGHT SCALE) MISC 1 Device by Does not apply route daily as needed. To weight self daily as needed at home. ICD-10 code: Z34.90 04/22/21   Bernerd Limbo, CNM  nitrofurantoin, macrocrystal-monohydrate, (MACROBID) 100 MG capsule Take 1 capsule (100 mg total) by mouth 2 (two) times daily. 04/16/22   Harris, Cammy Copa, PA-C  potassium chloride SA (KLOR-CON M) 20 MEQ tablet Take 1 tablet (20 mEq total) by mouth 2 (two) times daily. 04/16/22   Arthor Captain, PA-C  Prenatal Vit-Fe Phos-FA-Omega (VITAFOL GUMMIES) 3.33-0.333-34.8 MG CHEW Chew 3 each by mouth daily. 04/22/21   Bernerd Limbo, CNM      Allergies    Patient has no known allergies.  Review of Systems   Review of Systems  Physical Exam Updated Vital Signs BP (!) 167/99   Pulse 66   Temp 98 F (36.7 C)   Resp 17   Ht 5\' 3"  (1.6 m)   Wt 76.7 kg   SpO2 100%   BMI 29.94 kg/m  Physical Exam Constitutional:      Comments: Alert nontoxic.  Well-nourished well-developed.  No respiratory distress.  Mental status clear.  HENT:     Head: Normocephalic and atraumatic.     Mouth/Throat:     Mouth: Mucous membranes are moist.     Pharynx: Oropharynx is clear.  Eyes:     Extraocular Movements: Extraocular movements intact.      Conjunctiva/sclera: Conjunctivae normal.     Pupils: Pupils are equal, round, and reactive to light.  Cardiovascular:     Rate and Rhythm: Normal rate and regular rhythm.  Pulmonary:     Effort: Pulmonary effort is normal.     Breath sounds: Normal breath sounds.  Abdominal:     General: There is no distension.     Palpations: Abdomen is soft.     Tenderness: There is no abdominal tenderness. There is no guarding.  Musculoskeletal:        General: No swelling or tenderness. Normal range of motion.     Cervical back: Neck supple.     Right lower leg: No edema.     Left lower leg: No edema.  Skin:    General: Skin is warm and dry.     Comments: Patient has hyper trophic thickened skin on the neck and upper abdomen.  Consistent with eczema as described by the patient.  Neurological:     General: No focal deficit present.     Mental Status: She is oriented to person, place, and time.     Motor: No weakness.     Coordination: Coordination normal.  Psychiatric:        Mood and Affect: Mood normal.     ED Results / Procedures / Treatments   Labs (all labs ordered are listed, but only abnormal results are displayed) Labs Reviewed  COMPREHENSIVE METABOLIC PANEL - Abnormal; Notable for the following components:      Result Value   Potassium 3.1 (*)    BUN <5 (*)    Total Protein 8.2 (*)    AST 63 (*)    Total Bilirubin 1.7 (*)    Anion gap 16 (*)    All other components within normal limits  CBC - Abnormal; Notable for the following components:   RBC 3.64 (*)    HCT 35.7 (*)    All other components within normal limits  URINALYSIS, ROUTINE W REFLEX MICROSCOPIC - Abnormal; Notable for the following components:   Color, Urine BROWN (*)    APPearance CLOUDY (*)    Hgb urine dipstick LARGE (*)    Bilirubin Urine SMALL (*)    Ketones, ur 80 (*)    Protein, ur 100 (*)    Leukocytes,Ua SMALL (*)    All other components within normal limits  RAPID URINE DRUG SCREEN, HOSP  PERFORMED - Abnormal; Notable for the following components:   Amphetamines POSITIVE (*)    All other components within normal limits  URINALYSIS, MICROSCOPIC (REFLEX) - Abnormal; Notable for the following components:   Bacteria, UA MANY (*)    All other components within normal limits  RESP PANEL BY RT-PCR (RSV, FLU A&B, COVID)  [id number]  GROUP A STREP  BY PCR  URINE CULTURE  LIPASE, BLOOD  PREGNANCY, URINE  ETHANOL    EKG None  Radiology No results found.  Procedures Procedures    Medications Ordered in ED Medications  0.9 %  sodium chloride infusion ( Intravenous New Bag/Given 05/04/23 1840)  ondansetron (ZOFRAN-ODT) disintegrating tablet 4 mg (4 mg Oral Given 05/04/23 1315)  lactated ringers bolus 1,000 mL (0 mLs Intravenous Stopping previously hung infusion 05/04/23 1841)  pantoprazole (PROTONIX) injection 40 mg (40 mg Intravenous Given 05/04/23 1709)  potassium chloride 10 mEq in 100 mL IVPB (0 mEq Intravenous Stopping previously hung infusion 05/04/23 1842)  cefTRIAXone (ROCEPHIN) 1 g in sodium chloride 0.9 % 100 mL IVPB (1 g Intravenous New Bag/Given 05/04/23 1831)    ED Course/ Medical Decision Making/ A&P                                 Medical Decision Making Amount and/or Complexity of Data Reviewed Labs: ordered.  Risk Prescription drug management.   Presents with report of multiple episodes of vomiting.  Denies other medical problems.  Patient does not have significant or reproducible abdominal pain to suggest surgical etiology.  Patient does have atypical eating patterns going multiple days of fasting and then eating larger amounts.  This may be contributing factor.  Also patient does have near daily alcohol use which could be a factor in gastritis with nausea and vomiting.  At this time we will proceed with lab work and treat symptomatically with Zofran, fluid resuscitation and IV Protonix.  Ethanol less than 10.  Potassium 3.1.  GFR greater than 60.  Total bili  1.7.  Anion gap 16.  White count 7.5.  H&H 12.3 and 35.  Normal platelets at 382.  Lipase 21.  COVID and influenza testing negative.  Group strep a negative.  At this time with hypokalemia likely due to poor dietary intake and vomiting, will replace with both oral dose as tolerated and IV dose of potassium.  Will also hydrate with lactated Ringer's.  Patient's urinalysis appears significantly infected with 11-20 white cells and 11-20 red cells with many bacteria.  Patient does report frequent urinary tract infection.  At this time with combination of vomiting and malaise will opt to treat this with Rocephin and culture.  After hydration, patient is improved in appearance.  She is tolerating oral fluids.  I have reviewed plan for treatment on outpatient basis and recommend follow-up.  Return precautions reviewed.        Final Clinical Impression(s) / ED Diagnoses Final diagnoses:  Dehydration  Acute cystitis without hematuria  Hypokalemia  Nausea and vomiting, unspecified vomiting type    Rx / DC Orders ED Discharge Orders          Ordered    ondansetron (ZOFRAN-ODT) 4 MG disintegrating tablet  Every 8 hours PRN        05/04/23 2039    potassium chloride SA (KLOR-CON M) 20 MEQ tablet  2 times daily        05/04/23 2039    omeprazole (PRILOSEC) 20 MG capsule  Daily        05/04/23 2039    cephALEXin (KEFLEX) 500 MG capsule  4 times daily        05/04/23 2041              Arby Barrette, MD 05/04/23 2050

## 2023-05-04 NOTE — ED Notes (Signed)
Up to b/r for specimen, alert, NAD, calm, interactive, steady gait.

## 2023-05-04 NOTE — ED Notes (Signed)
Attempted IV x3, unsuccessful despite [place]. Tolerated well.

## 2023-05-04 NOTE — ED Triage Notes (Signed)
Pt c/o body aches, sore throat & vomiting that started this morning.

## 2023-05-04 NOTE — ED Notes (Addendum)
IVF, and KCL run infusing. Tolerating well. Site U. Given saltines and ginger ale.Rocephin to be given after LR bolus.

## 2023-05-08 ENCOUNTER — Telehealth (HOSPITAL_BASED_OUTPATIENT_CLINIC_OR_DEPARTMENT_OTHER): Payer: Self-pay | Admitting: *Deleted

## 2023-05-08 NOTE — Telephone Encounter (Signed)
Post ED Visit - Positive Culture Follow-up  Culture report reviewed by antimicrobial stewardship pharmacist: [facility] [x]  [facility], [place].D. []  Celedonio Miyamoto, Pharm.D., BCPS AQ-ID []  Garvin Fila, Pharm.D., BCPS []  Georgina Pillion, Pharm.D., BCPS []  [place], [address].D., BCPS, AAHIVP []  Estella Husk, Pharm.D., BCPS, AAHIVP []  Lysle Pearl, PharmD, BCPS []  Phillips Climes, PharmD, BCPS []  Agapito Games, PharmD, BCPS []  Verlan Friends, PharmD []  Mervyn Gay, PharmD, BCPS []  Vinnie Level, PharmD  [facility] []  Len Childs, PharmD []  Greer Pickerel, PharmD []  Adalberto Cole, PharmD []  Perlie Gold, Rph []  Lonell Face) Jean Rosenthal, PharmD []  Earl Many, PharmD []  Junita Push, PharmD []  Dorna Leitz, PharmD []  Terrilee Files, PharmD []  Lynann Beaver, PharmD []  Keturah Barre, PharmD []  Loralee Pacas, PharmD []  Bernadene Person, PharmD   Positive urine culture Treated with Cephalexin, organism sensitive to the same and no further patient follow-up is required at this time.  Virl Axe [facility] 05/08/2023, 11:53 AM

## 2023-08-09 ENCOUNTER — Encounter (HOSPITAL_BASED_OUTPATIENT_CLINIC_OR_DEPARTMENT_OTHER): Payer: Self-pay | Admitting: Emergency Medicine

## 2023-08-09 ENCOUNTER — Emergency Department (HOSPITAL_BASED_OUTPATIENT_CLINIC_OR_DEPARTMENT_OTHER)
Admission: EM | Admit: 2023-08-09 | Discharge: 2023-08-09 | Disposition: A | Discharge status: Home / Self Care | Payer: Medicaid Other | Attending: Emergency Medicine | Admitting: Emergency Medicine

## 2023-08-09 DIAGNOSIS — R531 Weakness: Secondary | ICD-10-CM | POA: Insufficient documentation

## 2023-08-09 DIAGNOSIS — J45909 Unspecified asthma, uncomplicated: Secondary | ICD-10-CM | POA: Diagnosis not present

## 2023-08-09 DIAGNOSIS — F1010 Alcohol abuse, uncomplicated: Secondary | ICD-10-CM | POA: Insufficient documentation

## 2023-08-09 DIAGNOSIS — Z789 Other specified health status: Secondary | ICD-10-CM

## 2023-08-09 DIAGNOSIS — R000 Tachycardia, unspecified: Secondary | ICD-10-CM | POA: Insufficient documentation

## 2023-08-09 DIAGNOSIS — R112 Nausea with vomiting, unspecified: Secondary | ICD-10-CM | POA: Insufficient documentation

## 2023-08-09 DIAGNOSIS — Y900 Blood alcohol level of less than 20 mg/100 ml: Secondary | ICD-10-CM | POA: Insufficient documentation

## 2023-08-09 DIAGNOSIS — R748 Abnormal levels of other serum enzymes: Secondary | ICD-10-CM | POA: Insufficient documentation

## 2023-08-09 LAB — CBC WITH DIFFERENTIAL/PLATELET
Abs Immature Granulocytes: 0.02 10*3/uL (ref 0.00–0.07)
Basophils Absolute: 0 10*3/uL (ref 0.0–0.1)
Basophils Relative: 0 %
Eosinophils Absolute: 0 10*3/uL (ref 0.0–0.5)
Eosinophils Relative: 0 %
HCT: 41.7 % (ref 36.0–46.0)
Hemoglobin: 14.1 g/dL (ref 12.0–15.0)
Immature Granulocytes: 0 %
Lymphocytes Relative: 17 %
Lymphs Abs: 1.4 10*3/uL (ref 0.7–4.0)
MCH: 31.3 pg (ref 26.0–34.0)
MCHC: 33.8 g/dL (ref 30.0–36.0)
MCV: 92.5 fL (ref 80.0–100.0)
Monocytes Absolute: 0.3 10*3/uL (ref 0.1–1.0)
Monocytes Relative: 3 %
Neutro Abs: 6.5 10*3/uL (ref 1.7–7.7)
Neutrophils Relative %: 80 %
Platelets: 285 10*3/uL (ref 150–400)
RBC: 4.51 MIL/uL (ref 3.87–5.11)
RDW: 13.5 % (ref 11.5–15.5)
WBC: 8.2 10*3/uL (ref 4.0–10.5)
nRBC: 0 % (ref 0.0–0.2)

## 2023-08-09 LAB — COMPREHENSIVE METABOLIC PANEL
ALT: 21 U/L (ref 0–44)
AST: 40 U/L (ref 15–41)
Albumin: 4.9 g/dL (ref 3.5–5.0)
Alkaline Phosphatase: 77 U/L (ref 38–126)
Anion gap: 16 — ABNORMAL HIGH (ref 5–15)
BUN: 10 mg/dL (ref 6–20)
CO2: 22 mmol/L (ref 22–32)
Calcium: 9.2 mg/dL (ref 8.9–10.3)
Chloride: 99 mmol/L (ref 98–111)
Creatinine, Ser: 0.97 mg/dL (ref 0.44–1.00)
GFR, Estimated: >60 mL/min (ref >60)
Glucose, Bld: 103 mg/dL — ABNORMAL HIGH (ref 70–99)
Potassium: 4.1 mmol/L (ref 3.5–5.1)
Sodium: 137 mmol/L (ref 135–145)
Total Bilirubin: 1.3 mg/dL — ABNORMAL HIGH (ref <1.2)
Total Protein: 9.8 g/dL — ABNORMAL HIGH (ref 6.5–8.1)

## 2023-08-09 LAB — ETHANOL: Alcohol, Ethyl (B): <10 mg/dL (ref <10)

## 2023-08-09 LAB — LIPASE, BLOOD: Lipase: 21 U/L (ref 11–51)

## 2023-08-09 MED ORDER — SODIUM CHLORIDE 0.9 % IV BOLUS
1000.0000 mL | Freq: Once | INTRAVENOUS | Status: AC
  Administered 2023-08-09: 1000 mL via INTRAVENOUS

## 2023-08-09 MED ORDER — ONDANSETRON HCL 4 MG/2ML IJ SOLN
4.0000 mg | Freq: Once | INTRAMUSCULAR | Status: AC
  Administered 2023-08-09: 4 mg via INTRAVENOUS
  Filled 2023-08-09: qty 2

## 2023-08-09 NOTE — ED Notes (Signed)
D/c paperwork reviewed with pt, including follow up care.  No questions or concerns voiced at time of d/c. . Pt verbalized understanding, Ambulatory without assistance to ED exit, NAD.   

## 2023-08-09 NOTE — ED Provider Notes (Signed)
[facility] Provider Note   CSN: [id number] Arrival date & time: 08/09/23  1547     History  Chief Complaint  Patient presents with   Emesis    Sharon Morris is a 27 y.o. female with past medical history significant for anemia, asthma presents to the ED complaining of generalized weakness, nausea, and vomiting.  Patient reports she "drank a lot of alcohol" last night.  She states she did not eat anything while drinking.  She reports consuming multiple drinks of beer, wine, and hard liquor.  She feels generally unwell, but denies any pain.  Denies abdominal pain, diarrhea, fever, headache, visual disturbance, dizziness, light-headedness, syncope.        Home Medications Prior to Admission medications   Medication Sig Start Date End Date Taking? Authorizing Provider  Blood Pressure Monitoring (BLOOD PRESSURE MONITOR AUTOMAT) DEVI 1 Device by Does not apply route daily. Automatic blood pressure cuff regular size. To monitor blood pressure regularly at home. ICD-10 code:Z34.90 04/22/21   Bernerd Limbo, CNM  cephALEXin (KEFLEX) 500 MG capsule Take 1 capsule (500 mg total) by mouth 4 (four) times daily. 05/04/23   Arby Barrette, MD  docusate sodium (COLACE) 100 MG capsule Take 1 capsule (100 mg total) by mouth 2 (two) times daily. 04/22/21   Bernerd Limbo, CNM  doxycycline (VIBRAMYCIN) 100 MG capsule Take 1 capsule (100 mg total) by mouth 2 (two) times daily. One po bid x 7 days 04/16/22   Arthor Captain, PA-C  Misc. Devices (GOJJI WEIGHT SCALE) MISC 1 Device by Does not apply route daily as needed. To weight self daily as needed at home. ICD-10 code: Z34.90 04/22/21   Bernerd Limbo, CNM  nitrofurantoin, macrocrystal-monohydrate, (MACROBID) 100 MG capsule Take 1 capsule (100 mg total) by mouth 2 (two) times daily. 04/16/22   Arthor Captain, PA-C  omeprazole (PRILOSEC) 20 MG capsule Take 1 capsule (20 mg total) by mouth daily. 05/04/23    Arby Barrette, MD  ondansetron (ZOFRAN-ODT) 4 MG disintegrating tablet Take 1 tablet (4 mg total) by mouth every 8 (eight) hours as needed for nausea or vomiting. 05/04/23   Arby Barrette, MD  potassium chloride SA (KLOR-CON M) 20 MEQ tablet Take 1 tablet (20 mEq total) by mouth 2 (two) times daily. 04/16/22   Harris, Cammy Copa, PA-C  potassium chloride SA (KLOR-CON M) 20 MEQ tablet Take 1 tablet (20 mEq total) by mouth 2 (two) times daily. 05/04/23   Arby Barrette, MD  Prenatal Vit-Fe Phos-FA-Omega (VITAFOL GUMMIES) 3.33-0.333-34.8 MG CHEW Chew 3 each by mouth daily. 04/22/21   Bernerd Limbo, CNM      Allergies    Patient has no known allergies.    Review of Systems   Review of Systems  Constitutional:  Negative for fever.  Eyes:  Negative for visual disturbance.  Gastrointestinal:  Positive for nausea and vomiting. Negative for abdominal pain and diarrhea.  Neurological:  Positive for weakness (generalized). Negative for dizziness, syncope and light-headedness.    Physical Exam Updated Vital Signs BP (!) 153/105   Pulse (!) 104   Temp 97.7 F (36.5 C) (Oral)   Resp 17   Ht 5\' 3"  (1.6 m)   Wt 79.8 kg   LMP 07/26/2023   SpO2 100%   BMI 31.18 kg/m  Physical Exam Vitals and nursing note reviewed.  Constitutional:      General: She is not in acute distress.    Appearance: She is ill-appearing.  Comments: Patient appears fatigued, but is not lethargic or somnolent.   HENT:     Mouth/Throat:     Mouth: Mucous membranes are moist.     Pharynx: Oropharynx is clear.  Eyes:     General: Vision grossly intact.     Conjunctiva/sclera: Conjunctivae normal.  Cardiovascular:     Rate and Rhythm: Regular rhythm. Tachycardia present.     Pulses: Normal pulses.     Heart sounds: Normal heart sounds.  Pulmonary:     Effort: Pulmonary effort is normal. No respiratory distress.     Breath sounds: Normal breath sounds and air entry.  Abdominal:     General: Abdomen is flat. Bowel  sounds are normal. There is no distension.     Palpations: Abdomen is soft.     Tenderness: There is no abdominal tenderness.  Skin:    General: Skin is warm and dry.     Capillary Refill: Capillary refill takes less than 2 seconds.  Neurological:     Mental Status: She is alert. Mental status is at baseline.     Comments: Patient moving all extremities appropriately.  Psychiatric:        Mood and Affect: Mood normal.        Behavior: Behavior normal.     ED Results / Procedures / Treatments   Labs (all labs ordered are listed, but only abnormal results are displayed) Labs Reviewed  COMPREHENSIVE METABOLIC PANEL - Abnormal; Notable for the following components:      Result Value   Glucose, Bld 103 (*)    Total Protein 9.8 (*)    Total Bilirubin 1.3 (*)    Anion gap 16 (*)    All other components within normal limits  ETHANOL  CBC WITH DIFFERENTIAL/PLATELET  LIPASE, BLOOD    EKG None  Radiology No results found.  Procedures Procedures    Medications Ordered in ED Medications  ondansetron (ZOFRAN) injection 4 mg (4 mg Intravenous Given 08/09/23 1614)  sodium chloride 0.9 % bolus 1,000 mL (0 mLs Intravenous Stopped 08/09/23 1732)    ED Course/ Medical Decision Making/ A&P                                 Medical Decision Making Amount and/or Complexity of Data Reviewed Labs: ordered.  Risk Prescription drug management.   This patient presents to the ED with chief complaint(s) of generalized weakness, nausea, vomiting with pertinent past medical history of anemia.  The complaint involves an extensive differential diagnosis and also carries with it a high risk of complications and morbidity.    The differential diagnosis includes alcoholic gastritis, veisalgia, metabolic derangement, dehydration, alcoholic ketosis    The initial plan is to obtain labs, give fluids  Initial Assessment:   Exam significant for ill-appearing patient who is moaning.  No active  vomiting.  Given is soft and nontender to palpation.  Patient is moving all extremities appropriately.  She is alert and oriented.  Heart rate is mildly tachycardic at 102-104 bpm.  Lungs clear to auscultation bilaterally.  Skin is warm and dry.    Independent ECG/labs interpretation:  The following labs were independently interpreted:  CBC without leukocytosis or anemia.  Metabolic panel with mildly elevated total protein and bilirubin.  Anion gap mildly elevated, likely related to alcohol consumption.  Lipase and ethanol unremarkable.  Treatment and Reassessment: Patient given Zofran and 1L of NS with significant improvement in  symptoms.  Patient was able to tolerate drinking fluids without difficulty.  She did not have any episodes of emesis while in the ED.    Disposition:   Advised patient to increase her fluid intake and abstain from alcohol consumption over the next several days.  Suspect her symptoms are related to binge alcohol consumption.  The patient has been appropriately medically screened and/or stabilized in the ED. I have low suspicion for any other emergent medical condition which would require further screening, evaluation or treatment in the ED or require inpatient management. At time of discharge the patient is hemodynamically stable and in no acute distress. I have discussed work-up results and diagnosis with patient and answered all questions. Patient is agreeable with discharge plan. We discussed strict return precautions for returning to the emergency department and they verbalized understanding.     Social Determinants of Health:   Patient's  alcohol use  increases the complexity of managing their presentation         Final Clinical Impression(s) / ED Diagnoses Final diagnoses:  Nausea and vomiting, unspecified vomiting type  Generalized weakness  Episode of binge consumption of alcohol    Rx / DC Orders ED Discharge Orders     None         Lenard Simmer, PA-C 08/09/23 1847    Ernie Avena, MD 08/09/23 2223

## 2023-08-09 NOTE — ED Notes (Signed)
Pt tolerating PO intake, reports she is comfortable with d/c. EDP Megan made aware.

## 2023-08-09 NOTE — ED Notes (Signed)
Pt provided ginger ale, saltine crackers for PO challenge.

## 2023-08-09 NOTE — ED Triage Notes (Signed)
Pt reports NV, general weakness and "can't feel the right side of my body" since this morning; sts she drank a lot of alcohol last night

## 2023-08-09 NOTE — Discharge Instructions (Addendum)
Thank you for allowing [place] to be a part of your care today.  You were evaluated in the ED for generalized weakness, nausea, and vomiting.  I suspect your symptoms are related to consuming a large amount of alcohol the night prior.  I do recommend increasing your fluid intake over the next few days and abstaining from alcohol.  You may incorporate electrolyte drinks as well.    Return to the ED if you develop sudden worsening of your symptoms or if you have new concerns.
